# Patient Record
Sex: Male | Born: 1950 | Race: Black or African American | Hispanic: No | Marital: Married | Smoking: Never smoker
Health system: Southern US, Community
[De-identification: ages and names within clinical notes are randomized; demographics above are authoritative.]

## PROBLEM LIST (undated history)

## (undated) DIAGNOSIS — K5790 Diverticulosis of intestine, part unspecified, without perforation or abscess without bleeding: Secondary | ICD-10-CM

## (undated) DIAGNOSIS — Z1211 Encounter for screening for malignant neoplasm of colon: Secondary | ICD-10-CM

## (undated) DIAGNOSIS — R195 Other fecal abnormalities: Secondary | ICD-10-CM

## (undated) DIAGNOSIS — C61 Malignant neoplasm of prostate: Secondary | ICD-10-CM

## (undated) DIAGNOSIS — K648 Other hemorrhoids: Secondary | ICD-10-CM

## (undated) DIAGNOSIS — Z7901 Long term (current) use of anticoagulants: Secondary | ICD-10-CM

## (undated) DIAGNOSIS — D126 Benign neoplasm of colon, unspecified: Secondary | ICD-10-CM

## (undated) DIAGNOSIS — I480 Paroxysmal atrial fibrillation: Secondary | ICD-10-CM

## (undated) DIAGNOSIS — R8290 Unspecified abnormal findings in urine: Secondary | ICD-10-CM

## (undated) DIAGNOSIS — IMO0001 Reserved for inherently not codable concepts without codable children: Secondary | ICD-10-CM

## (undated) DIAGNOSIS — I10 Essential (primary) hypertension: Secondary | ICD-10-CM

## (undated) DIAGNOSIS — E119 Type 2 diabetes mellitus without complications: Secondary | ICD-10-CM

## (undated) DIAGNOSIS — E785 Hyperlipidemia, unspecified: Secondary | ICD-10-CM

## (undated) DIAGNOSIS — Z86010 Personal history of colonic polyps: Secondary | ICD-10-CM

## (undated) DIAGNOSIS — I4891 Unspecified atrial fibrillation: Secondary | ICD-10-CM

## (undated) HISTORY — DX: Other fecal abnormalities: R19.5

## (undated) HISTORY — DX: Other hemorrhoids: K64.8

## (undated) HISTORY — DX: Type 2 diabetes mellitus without complications: E11.9

## (undated) HISTORY — DX: Encounter for screening for malignant neoplasm of colon: Z12.11

## (undated) HISTORY — DX: Hyperlipidemia, unspecified: E78.5

## (undated) HISTORY — DX: Diverticulosis of intestine, part unspecified, without perforation or abscess without bleeding: K57.90

## (undated) HISTORY — DX: Essential (primary) hypertension: I10

## (undated) HISTORY — DX: Personal history of colonic polyps: Z86.010

## (undated) HISTORY — DX: Long term (current) use of anticoagulants: Z79.01

## (undated) HISTORY — DX: Benign neoplasm of colon, unspecified: D12.6

## (undated) HISTORY — PX: COLONOSCOPY: SHX174

## (undated) HISTORY — PX: POLYPECTOMY: SHX149

## (undated) HISTORY — DX: Reserved for inherently not codable concepts without codable children: IMO0001

## (undated) HISTORY — DX: Unspecified atrial fibrillation: I48.91

## (undated) HISTORY — DX: Paroxysmal atrial fibrillation: I48.0

---

## 1997-05-21 ENCOUNTER — Emergency Department (HOSPITAL_COMMUNITY): Admission: EM | Admit: 1997-05-21 | Discharge: 1997-05-21 | Payer: Self-pay | Admitting: Emergency Medicine

## 1998-06-16 ENCOUNTER — Ambulatory Visit (HOSPITAL_BASED_OUTPATIENT_CLINIC_OR_DEPARTMENT_OTHER): Admission: RE | Admit: 1998-06-16 | Discharge: 1998-06-16 | Payer: Self-pay | Admitting: Surgery

## 1998-12-28 ENCOUNTER — Emergency Department (HOSPITAL_COMMUNITY): Admission: EM | Admit: 1998-12-28 | Discharge: 1998-12-28 | Payer: Self-pay | Admitting: Emergency Medicine

## 2001-07-14 ENCOUNTER — Emergency Department (HOSPITAL_COMMUNITY): Admission: EM | Admit: 2001-07-14 | Discharge: 2001-07-15 | Payer: Self-pay | Admitting: Emergency Medicine

## 2001-07-14 ENCOUNTER — Encounter: Payer: Self-pay | Admitting: Emergency Medicine

## 2004-04-04 ENCOUNTER — Encounter: Admission: RE | Admit: 2004-04-04 | Discharge: 2004-04-04 | Payer: Self-pay | Admitting: Nephrology

## 2004-09-28 ENCOUNTER — Encounter: Admission: RE | Admit: 2004-09-28 | Discharge: 2004-09-28 | Payer: Self-pay | Admitting: Orthopedic Surgery

## 2007-08-17 ENCOUNTER — Emergency Department (HOSPITAL_COMMUNITY): Admission: EM | Admit: 2007-08-17 | Discharge: 2007-08-17 | Payer: Self-pay | Admitting: Emergency Medicine

## 2007-11-13 ENCOUNTER — Emergency Department (HOSPITAL_COMMUNITY): Admission: EM | Admit: 2007-11-13 | Discharge: 2007-11-13 | Payer: Self-pay | Admitting: Emergency Medicine

## 2009-02-04 HISTORY — PX: PROSTATE SURGERY: SHX751

## 2009-09-05 ENCOUNTER — Ambulatory Visit: Admission: RE | Admit: 2009-09-05 | Discharge: 2009-10-25 | Payer: Self-pay | Admitting: Radiation Oncology

## 2009-09-29 ENCOUNTER — Encounter: Admission: RE | Admit: 2009-09-29 | Discharge: 2009-09-29 | Payer: Self-pay | Admitting: Urology

## 2009-11-24 ENCOUNTER — Ambulatory Visit (HOSPITAL_BASED_OUTPATIENT_CLINIC_OR_DEPARTMENT_OTHER): Admission: RE | Admit: 2009-11-24 | Discharge: 2009-11-24 | Payer: Self-pay | Admitting: Urology

## 2009-12-14 ENCOUNTER — Ambulatory Visit
Admission: RE | Admit: 2009-12-14 | Discharge: 2010-01-12 | Payer: Self-pay | Source: Home / Self Care | Attending: Radiation Oncology | Admitting: Radiation Oncology

## 2010-03-19 ENCOUNTER — Emergency Department (HOSPITAL_COMMUNITY): Payer: BC Managed Care – PPO

## 2010-03-19 ENCOUNTER — Observation Stay (HOSPITAL_COMMUNITY)
Admission: EM | Admit: 2010-03-19 | Discharge: 2010-03-21 | Disposition: A | Discharge status: Home / Self Care | Payer: BC Managed Care – PPO | Attending: Internal Medicine | Admitting: Internal Medicine

## 2010-03-19 ENCOUNTER — Encounter (HOSPITAL_COMMUNITY): Payer: Self-pay

## 2010-03-19 DIAGNOSIS — I10 Essential (primary) hypertension: Secondary | ICD-10-CM | POA: Insufficient documentation

## 2010-03-19 DIAGNOSIS — R1013 Epigastric pain: Secondary | ICD-10-CM | POA: Insufficient documentation

## 2010-03-19 DIAGNOSIS — E860 Dehydration: Secondary | ICD-10-CM | POA: Insufficient documentation

## 2010-03-19 DIAGNOSIS — R197 Diarrhea, unspecified: Principal | ICD-10-CM | POA: Insufficient documentation

## 2010-03-19 DIAGNOSIS — E785 Hyperlipidemia, unspecified: Secondary | ICD-10-CM | POA: Insufficient documentation

## 2010-03-19 DIAGNOSIS — C61 Malignant neoplasm of prostate: Secondary | ICD-10-CM | POA: Insufficient documentation

## 2010-03-19 DIAGNOSIS — E119 Type 2 diabetes mellitus without complications: Secondary | ICD-10-CM | POA: Insufficient documentation

## 2010-03-19 DIAGNOSIS — I959 Hypotension, unspecified: Secondary | ICD-10-CM | POA: Insufficient documentation

## 2010-03-19 LAB — URINE MICROSCOPIC-ADD ON

## 2010-03-19 LAB — URINALYSIS, ROUTINE W REFLEX MICROSCOPIC
Bilirubin Urine: NEGATIVE
Ketones, ur: NEGATIVE mg/dL
Protein, ur: 100 mg/dL — AB
Urobilinogen, UA: 0.2 mg/dL (ref 0.0–1.0)
pH: 6.5 (ref 5.0–8.0)

## 2010-03-19 LAB — COMPREHENSIVE METABOLIC PANEL
ALT: 28 U/L (ref 0–53)
AST: 36 U/L (ref 0–37)
Alkaline Phosphatase: 47 U/L (ref 39–117)
BUN: 30 mg/dL — ABNORMAL HIGH (ref 6–23)
Calcium: 9.4 mg/dL (ref 8.4–10.5)
Chloride: 111 mEq/L (ref 96–112)
Creatinine, Ser: 1.97 mg/dL — ABNORMAL HIGH (ref 0.4–1.5)
GFR calc non Af Amer: 35 mL/min — ABNORMAL LOW (ref >60)
Glucose, Bld: 165 mg/dL — ABNORMAL HIGH (ref 70–99)
Potassium: 4.3 mEq/L (ref 3.5–5.1)
Sodium: 145 mEq/L (ref 135–145)
Total Protein: 6.9 g/dL (ref 6.0–8.3)

## 2010-03-19 LAB — POCT I-STAT 3, ART BLOOD GAS (G3+)
Bicarbonate: 18.9 mEq/L — ABNORMAL LOW (ref 20.0–24.0)
TCO2: 20 mmol/L (ref 0–100)
pCO2 arterial: 25 mmHg — ABNORMAL LOW (ref 35.0–45.0)
pH, Arterial: 7.488 — ABNORMAL HIGH (ref 7.350–7.450)

## 2010-03-19 LAB — POCT I-STAT, CHEM 8
BUN: 48 mg/dL — ABNORMAL HIGH (ref 6–23)
HCT: 53 % — ABNORMAL HIGH (ref 39.0–52.0)
Hemoglobin: 18 g/dL — ABNORMAL HIGH (ref 13.0–17.0)
Potassium: 5.1 mEq/L (ref 3.5–5.1)
Sodium: 140 mEq/L (ref 135–145)

## 2010-03-19 LAB — PROTIME-INR
INR: 0.87 (ref 0.00–1.49)
Prothrombin Time: 12 seconds (ref 11.6–15.2)
Prothrombin Time: 12.8 seconds (ref 11.6–15.2)

## 2010-03-19 LAB — POCT CARDIAC MARKERS
CKMB, poc: 1.6 ng/mL (ref 1.0–8.0)
Myoglobin, poc: 256 ng/mL (ref 12–200)
Troponin i, poc: <0.05 ng/mL (ref 0.00–0.09)

## 2010-03-19 LAB — CBC
MCH: 32.3 pg (ref 26.0–34.0)
MCHC: 35 g/dL (ref 30.0–36.0)
Platelets: 177 10*3/uL (ref 150–400)

## 2010-03-19 LAB — GLUCOSE, CAPILLARY

## 2010-03-19 LAB — OCCULT BLOOD, POC DEVICE: Fecal Occult Bld: POSITIVE

## 2010-03-19 LAB — LACTIC ACID, PLASMA: Lactic Acid, Venous: 2.5 mmol/L — ABNORMAL HIGH (ref 0.5–2.2)

## 2010-03-19 LAB — PROCALCITONIN: Procalcitonin: <0.1 ng/mL

## 2010-03-19 LAB — LIPASE, BLOOD: Lipase: 41 U/L (ref 11–59)

## 2010-03-19 LAB — DIFFERENTIAL
Basophils Absolute: 0 10*3/uL (ref 0.0–0.1)
Neutro Abs: 3.2 10*3/uL (ref 1.7–7.7)

## 2010-03-20 LAB — BASIC METABOLIC PANEL
CO2: 23 mEq/L (ref 19–32)
Calcium: 8.7 mg/dL (ref 8.4–10.5)
GFR calc Af Amer: 59 mL/min — ABNORMAL LOW (ref >60)
Potassium: 3.3 mEq/L — ABNORMAL LOW (ref 3.5–5.1)
Sodium: 143 mEq/L (ref 135–145)

## 2010-03-20 LAB — GLUCOSE, CAPILLARY
Glucose-Capillary: 119 mg/dL — ABNORMAL HIGH (ref 70–99)
Glucose-Capillary: 145 mg/dL — ABNORMAL HIGH (ref 70–99)
Glucose-Capillary: 226 mg/dL — ABNORMAL HIGH (ref 70–99)

## 2010-03-21 LAB — GLUCOSE, CAPILLARY: Glucose-Capillary: 115 mg/dL — ABNORMAL HIGH (ref 70–99)

## 2010-03-21 LAB — BASIC METABOLIC PANEL
BUN: 15 mg/dL (ref 6–23)
Creatinine, Ser: 1.33 mg/dL (ref 0.4–1.5)
GFR calc non Af Amer: 55 mL/min — ABNORMAL LOW (ref >60)
Potassium: 3.7 mEq/L (ref 3.5–5.1)

## 2010-03-25 LAB — CULTURE, BLOOD (ROUTINE X 2)
Culture  Setup Time: 201202131557
Culture  Setup Time: 201202131558
Culture: NO GROWTH

## 2010-03-26 NOTE — Discharge Summary (Signed)
NAMEJAMAUL, HEIST               ACCOUNT NO.:  [id number]  MEDICAL RECORD NO.:  [id number]           PATIENT TYPE:  I  LOCATION:  3738                         FACILITY:  [facility]  PHYSICIAN:  Jeoffrey Massed, MD    DATE OF BIRTH:  December 01, 1950  DATE OF ADMISSION:  03/19/2010 DATE OF DISCHARGE:                        DISCHARGE SUMMARY - REFERRING   PRIMARY CARE PRACTITIONER:  Dr. Caryn Bee L. Little, M.D. of [facility].  PRIMARY DISCHARGE DIAGNOSES: 1. Diarrhea likely viral, now resolved. 2. Acute kidney injury, now resolved. 3. Dehydration, now resolved. 4. Stool positive for occult blood will need outpatient workup. 5. Hypotension on admission secondary to diarrhea and dehydration.  SECONDARY DISCHARGE DIAGNOSES: 1. Hypertension. 2. History of prostate adenocarcinoma, status post brachytherapy. 3. Type 2 diabetes. 4. Benign prostatic hypertrophy. 5. Dyslipidemia.  DISCHARGE MEDICATIONS: 1. Actos 45 mg 1 tablet daily. 2. Amlodipine 10 mg 1 tablet p.o. daily. 3. Aspirin 81 mg 1 tablet p.o. daily. 4. Enalapril 20 mg 1 tablet p.o. twice daily. 5. Flomax 0.4 mg 1 capsule p.o. at bedtime. 6. Lipitor 40 mg 1 tablet p.o. daily. 7. Maalox 30 AML p.o. q.8 h p.r.n. 8. Rapaflo 8 mg 1 capsule p.o. daily. 9. Uribel capsule, 1 capsule p.o. 4 times a day. 10.Zantac 150 mg 1 tablet p.o. twice daily.  CONSULTATIONS:  None.  BRIEF HISTORY OF PRESENT ILLNESS:  The patient is a very pleasant 59- year-old [place] American male with a history of prostate cancer, status post brachytherapy in October 2011, came on the March 19, 2010 with abdominal pain and profuse diarrhea.  He described the diarrhea is watery and nonbloody.  He described the abdominal pain is cramping, which improved after passing stool.  In the ED, he was very dehydrated and hemodynamically unstable with a systolic blood pressure in the 60s. He was then resuscitated with IV fluids.  He was then admitted to  the hospitalist service for further evaluation and treatment.  For further details please see the history and physical that was dictated by Dr. Cleotis Lema on admission.  PERTINENT LABORATORY DATA: 1. Chemistries on discharge shows sodium of 143, potassium of 3.7,     chloride of 113, bicarb of 24, BUN of 15, creatinine of 1.33,     calcium of 8.7. 2. Blood cultures are negative so far. 3. Lactic acid on admission was 4.9.  Chemistries on admission showed a creatinine of 1.97.  RADIOLOGICAL STUDIES: 1. CT of the abdomen and pelvis without contrast showed a fluid-filled     colon.  Otherwise benign-appearing abdomen. 2. CT of the chest without contrast showed no acute abnormalities.  BRIEF HOSPITAL COURSE: 1. Diarrhea.  This was most likely possibly a viral process as the     patient did not have any evidence of fever or elevated WBC count.     However, the patient did have a low blood pressure and was     tachycardic and was dehydrated.  He was resuscitated by IV fluids.     He was empirically started on Flagyl; however, upon admission his    diarrhea completely resolves.  Unfortunately, since the diarrhea  resolved, we were not able to sent off any stool studies.  He     currently is able to tolerate a regular diet and is anxious to go     home.  Since there was no fever or leukocytosis, at this point his     diarrhea is presumed secondary to perhaps a viral process and his     antibiotics will be discontinued. 2. Hypotension.  This was secondary to above.  This resolved with     hydration. 3. Dehydration.  This is this was resolved with IV fluids and was     obviously secondary to diarrhea.  At this point tolerating a     regular diet.  IV fluids have been discontinued early this morning. 4. Acute kidney injury.  This was again secondary to prerenal     secondary to above mentioned causes.  This is resolved with IV     fluids. 5. History of hypertension.  This patient has a  history of     hypertension and apparently is on numerous antihypertensive     medications.  Because he is coming off an acute illness, where he     was hypovolemic, all of his antihypertensive medications have been     placed on hold.  On discharge, he will be resumed on 5 mg of     amlodipine and he will need to follow up with his primary care     practitioner in the next few days to reevaluated where his blood     pressure is and to see if he will need addition of his other     antihypertensive regimen. This was explained to the patient in     details and he claims he will call his primary care practitioner     and get an appointment as soon as possible. 6. Prostate cancer.  The patient is status post reactive seed     implantation in October of past year.  He is currently symptomatic     in terms of symptoms and he has restraints significantly to void,     but is able to void of a little.  The patient is currently     maintained on Flomax and Rapaflo and this will be continued on     discharge as well.  He is to follow up with his primary urologist     at his next scheduled appointment. 7. Dyslipidemia.  He is to continue Lipitor. 8. Diabetes.  He is to continue Actos.  DISPOSITION:  The patient will be discharged home.  FOLLOWUP INSTRUCTIONS: 1. The patient needs to follow up with his primary care practitioner     within 5-7 days upon discharge.  He used to call and make an     appointment, he claims understanding. 2. The patient is to follow up with his primary urologist at his next     scheduled appointment. 3. Upon follow up with his primary care practitioner, his blood     pressure needs to be assessed and further     antihypertensive medications will need to be added on if it is     high.  Currently, he will be discharged on just 5 mg of amlodipine     and rest of his antihypertensive medications will be placed on     hold.  Total time spent equal to 45  minutes.     Jeoffrey Massed, MD     SG/MEDQ  D:  03/21/2010  T:  03/21/2010  Job:  [id number]  cc:   Anna Genre. Little, M.D. Artist Pais Kathrynn Running, M.D. Veverly Fells. Vernie Ammons, M.D.  Electronically Signed by Jeoffrey Massed  on 03/26/2010 04:27:27 PM

## 2010-04-12 NOTE — H&P (Signed)
NAMEARBIE, BLANKLEY NO.:  [id number]  MEDICAL RECORD NO.:  [id number]           PATIENT TYPE:  E  LOCATION:  [facility]                         FACILITY:  [facility]  PHYSICIAN:  Damon Najjar, MD     DATE OF BIRTH:  [birth year]  DATE OF ADMISSION:  03/19/2010 DATE OF DISCHARGE:                             HISTORY & PHYSICAL   PCP:  Damon Genre. Little, MD.  ONCOLOGIST:  Damon Pais. Damon Running, MD.  UROLOGIST:  [facility].  CODE STATUS:  The patient is a full code.  CHIEF COMPLAINT:  Abdominal pain/diarrhea.  HISTORY OF PRESENT ILLNESS:  [name] is a 60 year old African- American man with history of prostate cancer status post seed implant back in October 2011.  The patient presented to the ED today with a chief complaint of profuse diarrhea and abdominal pain started around 6:00 a.m. this morning.  He described his diarrhea as watery, nonbloody, recurrent like 5-6 times since this a.m., large amount associated with abdominal cramping/discomfort which improved with passing stool.  He denies any associated nausea or vomiting.  Denies any fever or chills. The patient also denies any sick contacts and denies any change in his diet or eating any different kinds of food.  In the ED on presentation, the patient was initially hemodynamically unstable with systolic blood pressure in the 60 and he was tachycardic.  The patient was resuscitated with 3 liters of IV fluids.  He was found to have elevated lactate of 5. PCCM was consulted by the ED physician, however, they deferred admission to triad hospitalist since the patient's vital signs stabilized with IV fluid resuscitation.  PAST MEDICAL HISTORY: 1. Significant for prostate adenocarcinoma status post seed     implantation in October 2011, followed by Dr. Kathrynn Krueger. 2. Diabetes mellitus type 2. 3. Hypertension.  ALLERGIES:  No known drug allergies.  HOME MEDICATIONS: 1. Vasotec 20 mg p.o. b.i.d. 2.  Actos 45 mg p.o. daily. 3. Norvasc 10 mg p.o. daily. 4. Lipitor 40 mg at bedtime.  SOCIAL HISTORY:  He is married, lives with wife.  Works as a Airline pilot at [employer].  Denies any smoking.  Drinks alcohol socially.  REVIEW OF SYSTEMS:  CHEST:  Denies any cough or shortness of breath. CARDIOVASCULAR:  Denies any chest pain or palpitation.  ABDOMEN: Significant for profuse diarrhea, abdominal pain/discomfort.  No nausea. No vomiting.  Constitutional:  No fever or chills.  GU:  The patient has difficulty urinating.  No hematuria.  Denies any flank pain or dysuria.  FAMILY HISTORY:  Noncontributory.  PHYSICAL EXAM:  VITAL SIGNS:  Blood pressure 120/89, most recent blood pressure is 122/83, pulse 98, temperature 98.1, and O2 sat 96% on room air. GENERAL:  He is alert and oriented x3, in mild distress. NECK:  Supple.  No JVD. LUNGS:  Clear to auscultation bilaterally.  I did not appreciate any rales or wheezing. CARDIOVASCULAR:  S1 and S2, regular rhythm and rate.  No murmurs. ABDOMEN:  Obese, soft, nontender.  Bowel sounds heard normally. EXTREMITIES:  No pedal edema. NEUROLOGIC:  The patient moves all his extremities and  has no focal neurological deficit appreciated.  LABORATORY DATA:  Lactic acid 2.5, procalcitonin less than 0.10, lipase 41, potassium 4.3 stable but hemolyzed, CO2 of 22, glucose 165, BUN 30, creatinine 1.97.  GFR of 43.  Normal LFTs.  INR 0.94.  Cardiac enzymes, troponin less than 0.05.  Stool for occult blood positive.  RADIOLOGY/IMAGING:  CT scan of the chest showed no acute abnormalities. CT of abdomen and pelvis without contrast showed fluid-filled colon, otherwise benign-appearing abdomen.  Chest x-ray showed slight atelectasis at the right lung base.  ASSESSMENT AND PLAN:  [name] is a 60 year old African- American man with the above medical history presenting to the ER with sudden onset of abdominal pain and profuse diarrhea. 1.  Diarrhea, most likely secondary to gastroenteritis, probably viral,     however, Clostridium difficile has to be ruled out.  We will send     Clostridium difficile PCR.  The patient will be started on Flagyl     pending PCR results.  He will also be placed on contact isolation.     We will hydrate him with normal saline and keep him on clear liquid     diet for now.  His abdominal exam is currently benign and his     abdominal pain had resolved.  Send stool also for stool culture and     other stool studies. 2. Diabetes.  I will hold his oral hypoglycemic agents and keep him on     insulin sliding scale.  Monitor CBGs very closely. 3. Hypertension.  The patient is currently hypotensive, however,     corrected with IV fluid.  I will hold his antihypertensives for now     and continue to observe. 4. Prostate cancer status post seed implantation.  The patient to     follow with his oncologist as an outpatient. 5. Stool positivity for occult blood with no evidence of active bleed.     Continue to monitor his hemoglobin and hematocrit during this     hospitalization and repeat his stool for occult blood and that can     be followed also as an outpatient. 6. Acute kidney injury/chronic kidney disease.  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  He was previously     followed by Dr. Lowell Krueger from [facility].  His     creatinine today is 1.9, which is above his baseline as per him.     His baseline is around 1.3-1.4.  Elevated creatinine is most likely     secondary to dehydration and hopefully will improve with IV fluid.  DISPOSITION:  The patient will probably be discharged to home in the next 1-2 days.  CODE STATUS:  The patient is full code.          ______________________________ Damon Najjar, MD     SA/MEDQ  D:  03/19/2010  T:  03/19/2010  Job:  [id number]  cc:   Damon Bee L.  Krueger, M.D. Damon Pais Damon Krueger, M.D.  Electronically Signed by Hannah Beat MD on 04/11/2010 09:25:44 PM

## 2010-04-18 LAB — COMPREHENSIVE METABOLIC PANEL
ALT: 31 U/L (ref 0–53)
AST: 25 U/L (ref 0–37)
Albumin: 3.6 g/dL (ref 3.5–5.2)
Alkaline Phosphatase: 59 U/L (ref 39–117)
BUN: 30 mg/dL — ABNORMAL HIGH (ref 6–23)
CO2: 30 mEq/L (ref 19–32)
Calcium: 9.5 mg/dL (ref 8.4–10.5)
Chloride: 106 mEq/L (ref 96–112)
Creatinine, Ser: 1.27 mg/dL (ref 0.4–1.5)
GFR calc Af Amer: >60 mL/min (ref >60)
GFR calc non Af Amer: 58 mL/min — ABNORMAL LOW (ref >60)
Glucose, Bld: 136 mg/dL — ABNORMAL HIGH (ref 70–99)
Potassium: 4.2 mEq/L (ref 3.5–5.1)
Sodium: 140 mEq/L (ref 135–145)
Total Bilirubin: 0.8 mg/dL (ref 0.3–1.2)
Total Protein: 6.7 g/dL (ref 6.0–8.3)

## 2010-04-18 LAB — GLUCOSE, CAPILLARY: Glucose-Capillary: 135 mg/dL — ABNORMAL HIGH (ref 70–99)

## 2010-04-18 LAB — CBC
HCT: 46.2 % (ref 39.0–52.0)
Hemoglobin: 15.6 g/dL (ref 13.0–17.0)
MCH: 32.6 pg (ref 26.0–34.0)
MCHC: 33.8 g/dL (ref 30.0–36.0)
MCV: 96.5 fL (ref 78.0–100.0)
Platelets: 145 10*3/uL — ABNORMAL LOW (ref 150–400)
RBC: 4.78 MIL/uL (ref 4.22–5.81)
RDW: 15 % (ref 11.5–15.5)
WBC: 6.5 10*3/uL (ref 4.0–10.5)

## 2010-04-18 LAB — PROTIME-INR
INR: 0.93 (ref 0.00–1.49)
Prothrombin Time: 12.7 seconds (ref 11.6–15.2)

## 2010-04-18 LAB — APTT: aPTT: 24 seconds (ref 24–37)

## 2010-11-05 LAB — DIFFERENTIAL
Basophils Absolute: 0
Basophils Relative: 0
Eosinophils Relative: 10 — ABNORMAL HIGH
Lymphocytes Relative: 33

## 2010-11-05 LAB — CK TOTAL AND CKMB (NOT AT ARMC)
CK, MB: 2.6
Relative Index: 1
Relative Index: 1
Total CK: 258 — ABNORMAL HIGH

## 2010-11-05 LAB — BASIC METABOLIC PANEL
Calcium: 9.1
GFR calc Af Amer: >60
GFR calc non Af Amer: >60
Glucose, Bld: 209 — ABNORMAL HIGH
Sodium: 136

## 2010-11-05 LAB — CBC
Hemoglobin: 14.5
RBC: 4.55
RDW: 14.6

## 2010-11-05 LAB — TROPONIN I: Troponin I: 0.01

## 2011-01-23 ENCOUNTER — Encounter: Payer: Self-pay | Admitting: *Deleted

## 2011-01-23 NOTE — Progress Notes (Signed)
09/05/09 I-PPS =1100112 score = 6 09/05/09 IIEF score= [id number]

## 2011-01-23 NOTE — Progress Notes (Signed)
On 12/15/09 I=PSS results [id number] score=26

## 2013-02-10 ENCOUNTER — Observation Stay (HOSPITAL_COMMUNITY)
Admission: EM | Admit: 2013-02-10 | Discharge: 2013-02-11 | Disposition: A | Discharge status: Home / Self Care | Payer: BC Managed Care – PPO | Attending: Cardiovascular Disease | Admitting: Cardiovascular Disease

## 2013-02-10 ENCOUNTER — Encounter (HOSPITAL_COMMUNITY): Payer: Self-pay | Admitting: Emergency Medicine

## 2013-02-10 ENCOUNTER — Emergency Department (HOSPITAL_COMMUNITY): Payer: BC Managed Care – PPO

## 2013-02-10 DIAGNOSIS — J069 Acute upper respiratory infection, unspecified: Secondary | ICD-10-CM | POA: Insufficient documentation

## 2013-02-10 DIAGNOSIS — E119 Type 2 diabetes mellitus without complications: Secondary | ICD-10-CM | POA: Insufficient documentation

## 2013-02-10 DIAGNOSIS — I10 Essential (primary) hypertension: Secondary | ICD-10-CM | POA: Insufficient documentation

## 2013-02-10 DIAGNOSIS — Z7982 Long term (current) use of aspirin: Secondary | ICD-10-CM | POA: Insufficient documentation

## 2013-02-10 DIAGNOSIS — Z23 Encounter for immunization: Secondary | ICD-10-CM | POA: Insufficient documentation

## 2013-02-10 DIAGNOSIS — I4891 Unspecified atrial fibrillation: Secondary | ICD-10-CM

## 2013-02-10 DIAGNOSIS — Z8546 Personal history of malignant neoplasm of prostate: Secondary | ICD-10-CM | POA: Insufficient documentation

## 2013-02-10 DIAGNOSIS — R823 Hemoglobinuria: Secondary | ICD-10-CM | POA: Insufficient documentation

## 2013-02-10 DIAGNOSIS — B9789 Other viral agents as the cause of diseases classified elsewhere: Secondary | ICD-10-CM | POA: Insufficient documentation

## 2013-02-10 HISTORY — DX: Unspecified abnormal findings in urine: R82.90

## 2013-02-10 HISTORY — DX: Type 2 diabetes mellitus without complications: E11.9

## 2013-02-10 HISTORY — DX: Unspecified atrial fibrillation: I48.91

## 2013-02-10 HISTORY — DX: Essential (primary) hypertension: I10

## 2013-02-10 HISTORY — DX: Malignant neoplasm of prostate: C61

## 2013-02-10 LAB — CBC WITH DIFFERENTIAL/PLATELET
BASOS ABS: 0 10*3/uL (ref 0.0–0.1)
BASOS PCT: 0 % (ref 0–1)
Eosinophils Absolute: 0.2 10*3/uL (ref 0.0–0.7)
Eosinophils Relative: 2 % (ref 0–5)
HCT: 45.7 % (ref 39.0–52.0)
Hemoglobin: 15.4 g/dL (ref 13.0–17.0)
LYMPHS PCT: 18 % (ref 12–46)
Lymphs Abs: 1.6 10*3/uL (ref 0.7–4.0)
MCH: 30.6 pg (ref 26.0–34.0)
MCHC: 33.7 g/dL (ref 30.0–36.0)
MCV: 90.9 fL (ref 78.0–100.0)
Monocytes Absolute: 0.8 10*3/uL (ref 0.1–1.0)
Monocytes Relative: 8 % (ref 3–12)
NEUTROS ABS: 6.5 10*3/uL (ref 1.7–7.7)
NEUTROS PCT: 72 % (ref 43–77)
Platelets: 165 10*3/uL (ref 150–400)
RBC: 5.03 MIL/uL (ref 4.22–5.81)
RDW: 14.4 % (ref 11.5–15.5)
WBC: 9.1 10*3/uL (ref 4.0–10.5)

## 2013-02-10 LAB — URINE MICROSCOPIC-ADD ON

## 2013-02-10 LAB — COMPREHENSIVE METABOLIC PANEL
ALK PHOS: 60 U/L (ref 39–117)
ALT: 25 U/L (ref 0–53)
AST: 18 U/L (ref 0–37)
Albumin: 3.4 g/dL — ABNORMAL LOW (ref 3.5–5.2)
BILIRUBIN TOTAL: 0.9 mg/dL (ref 0.3–1.2)
BUN: 22 mg/dL (ref 6–23)
CHLORIDE: 103 meq/L (ref 96–112)
CO2: 23 meq/L (ref 19–32)
Calcium: 9.2 mg/dL (ref 8.4–10.5)
Creatinine, Ser: 1.12 mg/dL (ref 0.50–1.35)
GFR calc Af Amer: 81 mL/min — ABNORMAL LOW (ref >90)
GFR calc non Af Amer: 70 mL/min — ABNORMAL LOW (ref >90)
Glucose, Bld: 224 mg/dL — ABNORMAL HIGH (ref 70–99)
Potassium: 4.1 mEq/L (ref 3.7–5.3)
Sodium: 142 mEq/L (ref 137–147)
Total Protein: 7.3 g/dL (ref 6.0–8.3)

## 2013-02-10 LAB — URINALYSIS, ROUTINE W REFLEX MICROSCOPIC
Bilirubin Urine: NEGATIVE
Glucose, UA: >1000 mg/dL — AB
KETONES UR: NEGATIVE mg/dL
Leukocytes, UA: NEGATIVE
Nitrite: NEGATIVE
PROTEIN: NEGATIVE mg/dL
Specific Gravity, Urine: 1.021 (ref 1.005–1.030)
Urobilinogen, UA: 0.2 mg/dL (ref 0.0–1.0)
pH: 5.5 (ref 5.0–8.0)

## 2013-02-10 LAB — POCT I-STAT TROPONIN I: Troponin i, poc: 0 ng/mL (ref 0.00–0.08)

## 2013-02-10 LAB — PROTIME-INR
INR: 1.03 (ref 0.00–1.49)
Prothrombin Time: 13.3 seconds (ref 11.6–15.2)

## 2013-02-10 LAB — TROPONIN I

## 2013-02-10 LAB — MAGNESIUM: MAGNESIUM: 1.8 mg/dL (ref 1.5–2.5)

## 2013-02-10 LAB — CG4 I-STAT (LACTIC ACID): Lactic Acid, Venous: 2.91 mmol/L — ABNORMAL HIGH (ref 0.5–2.2)

## 2013-02-10 LAB — KETONES, QUALITATIVE: Acetone, Bld: NEGATIVE

## 2013-02-10 MED ORDER — ACETAMINOPHEN 325 MG PO TABS: 650.0000 mg | ORAL_TABLET | ORAL | Status: DC | PRN

## 2013-02-10 MED ORDER — HEPARIN (PORCINE) IN NACL 100-0.45 UNIT/ML-% IJ SOLN
1300.0000 [IU]/h | INTRAMUSCULAR | Status: DC
  Administered 2013-02-10: 1300 [IU]/h via INTRAVENOUS
  Filled 2013-02-10 (×3): qty 250

## 2013-02-10 MED ORDER — TELMISARTAN-HCTZ 80-12.5 MG PO TABS: 1.0000 | ORAL_TABLET | Freq: Every day | ORAL | Status: DC

## 2013-02-10 MED ORDER — HYDROCHLOROTHIAZIDE 12.5 MG PO CAPS
12.5000 mg | ORAL_CAPSULE | Freq: Every day | ORAL | Status: DC
  Filled 2013-02-10: qty 1

## 2013-02-10 MED ORDER — DILTIAZEM HCL 25 MG/5ML IV SOLN
20.0000 mg | Freq: Once | INTRAVENOUS | Status: AC
  Administered 2013-02-10: 20 mg via INTRAVENOUS

## 2013-02-10 MED ORDER — ENALAPRIL MALEATE 20 MG PO TABS
20.0000 mg | ORAL_TABLET | Freq: Two times a day (BID) | ORAL | Status: DC
  Administered 2013-02-10 – 2013-02-11 (×2): 20 mg via ORAL
  Filled 2013-02-10 (×3): qty 1

## 2013-02-10 MED ORDER — IRBESARTAN 300 MG PO TABS
300.0000 mg | ORAL_TABLET | Freq: Every day | ORAL | Status: DC
  Administered 2013-02-10: 300 mg via ORAL
  Filled 2013-02-10 (×2): qty 1

## 2013-02-10 MED ORDER — ONDANSETRON HCL 4 MG/2ML IJ SOLN: 4.0000 mg | Freq: Four times a day (QID) | INTRAMUSCULAR | Status: DC | PRN

## 2013-02-10 MED ORDER — HEPARIN BOLUS VIA INFUSION
4000.0000 [IU] | Freq: Once | INTRAVENOUS | Status: AC
  Administered 2013-02-10: 4000 [IU] via INTRAVENOUS
  Filled 2013-02-10: qty 4000

## 2013-02-10 MED ORDER — DILTIAZEM HCL 100 MG IV SOLR
5.0000 mg/h | INTRAVENOUS | Status: DC
  Administered 2013-02-10: 5 mg/h via INTRAVENOUS
  Administered 2013-02-11: 15 mg/h via INTRAVENOUS
  Filled 2013-02-10 (×2): qty 100

## 2013-02-10 MED ORDER — ASPIRIN EC 81 MG PO TBEC
81.0000 mg | DELAYED_RELEASE_TABLET | Freq: Every day | ORAL | Status: DC
  Administered 2013-02-10: 81 mg via ORAL
  Filled 2013-02-10 (×2): qty 1

## 2013-02-10 MED ORDER — METFORMIN HCL 500 MG PO TABS
500.0000 mg | ORAL_TABLET | Freq: Two times a day (BID) | ORAL | Status: DC
  Administered 2013-02-11 (×2): 500 mg via ORAL
  Filled 2013-02-10 (×3): qty 1

## 2013-02-10 NOTE — ED Notes (Signed)
Admitting physician at bedside

## 2013-02-10 NOTE — ED Notes (Addendum)
Called pharmacy to send cardizem drip up. Not found in pyxis.

## 2013-02-10 NOTE — ED Notes (Signed)
Ordered heart healthy food tray.

## 2013-02-10 NOTE — ED Notes (Signed)
NOTIFIED DR. Stevie Kern IN PERSON OF PATIENTS CG4 LACTIC ACID LAB RESULTS @15 :06 PM ,02/10/2013.

## 2013-02-10 NOTE — H&P (Signed)
CC: New Onset Atrial Fibrillation w/ RVR PCP: Dr. Hulan Fess    HPI: The patient is a 63 y/o male, who works as a Arts development officer at [employer], with a [place] significant for HTN, DM and prostate cancer. He denies any prior cardiac history and no family history of any cardiac related issues. His PCP is Dr. Hulan Fess. He was seen today by Dr. Rex Kras in clinic for evaluation of flu-like symptoms x 7 days, including nasal congestion, non-productive cough and body aches. The patient reports taking OTC cold & flu medications x 4 days (last dose last PM) without relief. At Dr. Eddie Dibbles office, his flu test was negative. An EKG however demonstrated Atrial Fibrillation w/ RVR. This has never happened to him before. He has been asymptomatic with this, denying palpitations, chest discomfort, SOB, dizziness, syncope/ near syncope. He denies any history of resting or exertional angina. He exercises on a treadmill 3 days a week w/o limitation. He states that he has been told that he snores, on occasion, at night but has never had a sleep study to r/o OSA. No thyroid issues that he is aware of. No HF symptoms, denying SOB, orthopnea/PND and LEE. He typically drinks 2 cups of coffee a day, but none today. He is a social drinker but denies any alcohol intake in the last 24 hrs. Denies fever, chills, n/v/d. No dysuria. No melena or hematochezia. No history of any bleeding disorders, PUD, stroke/TIA. Does not smoke.   Past Medical History  Diagnosis Date  . Diabetes mellitus   . Hypertension   . Cancer     prostate ca    Past Surgical History  Procedure Laterality Date  . Prostate surgery  2011    Family History  Problem Relation Age of Onset  . Hypertension      Social History:  reports that he has never smoked. He does not have any smokeless tobacco history on file. He reports that he drinks alcohol. He reports that he does not use illicit drugs.  Allergies: No Known  Allergies  Medications: Prior to Admission medications   Medication Sig Start Date End Date Taking? Authorizing Provider  amLODipine (NORVASC) 5 MG tablet Take 5 mg by mouth daily.   Yes Historical Provider, MD  aspirin EC 81 MG tablet Take 81 mg by mouth daily.   Yes Historical Provider, MD  enalapril (VASOTEC) 20 MG tablet Take 20 mg by mouth 2 (two) times daily.   Yes Historical Provider, MD  metFORMIN (GLUCOPHAGE) 500 MG tablet Take 500 mg by mouth 2 (two) times daily with a meal.   Yes Historical Provider, MD  OVER THE COUNTER MEDICATION Take 2 capsules by mouth 2 (two) times daily as needed (cold symptoms). Multi Cold Symptom Relief.   Yes Historical Provider, MD  telmisartan-hydrochlorothiazide (MICARDIS HCT) 80-12.5 MG per tablet Take 1 tablet by mouth daily.   Yes Historical Provider, MD     Results for orders placed during the hospital encounter of 02/10/13 (from the past 48 hour(s))  CBC WITH DIFFERENTIAL     Status: None   Collection Time    02/10/13  2:05 PM      Result Value Range   WBC 9.1  4.0 - 10.5 K/uL   RBC 5.03  4.22 - 5.81 MIL/uL   Hemoglobin 15.4  13.0 - 17.0 g/dL   HCT 45.7  39.0 - 52.0 %   MCV 90.9  78.0 - 100.0 fL   MCH 30.6  26.0 -  34.0 pg   MCHC 33.7  30.0 - 36.0 g/dL   RDW 14.4  11.5 - 15.5 %   Platelets 165  150 - 400 K/uL   Neutrophils Relative % 72  43 - 77 %   Neutro Abs 6.5  1.7 - 7.7 K/uL   Lymphocytes Relative 18  12 - 46 %   Lymphs Abs 1.6  0.7 - 4.0 K/uL   Monocytes Relative 8  3 - 12 %   Monocytes Absolute 0.8  0.1 - 1.0 K/uL   Eosinophils Relative 2  0 - 5 %   Eosinophils Absolute 0.2  0.0 - 0.7 K/uL   Basophils Relative 0  0 - 1 %   Basophils Absolute 0.0  0.0 - 0.1 K/uL  COMPREHENSIVE METABOLIC PANEL     Status: Abnormal   Collection Time    02/10/13  2:05 PM      Result Value Range   Sodium 142  137 - 147 mEq/L   Potassium 4.1  3.7 - 5.3 mEq/L   Chloride 103  96 - 112 mEq/L   CO2 23  19 - 32 mEq/L   Glucose, Bld 224 (*) 70 - 99  mg/dL   BUN 22  6 - 23 mg/dL   Creatinine, Ser 1.12  0.50 - 1.35 mg/dL   Calcium 9.2  8.4 - 10.5 mg/dL   Total Protein 7.3  6.0 - 8.3 g/dL   Albumin 3.4 (*) 3.5 - 5.2 g/dL   AST 18  0 - 37 U/L   ALT 25  0 - 53 U/L   Alkaline Phosphatase 60  39 - 117 U/L   Total Bilirubin 0.9  0.3 - 1.2 mg/dL   GFR calc non Af Amer 70 (*) >90 mL/min   GFR calc Af Amer 81 (*) >90 mL/min   Comment: (NOTE)     The eGFR has been calculated using the CKD EPI equation.     This calculation has not been validated in all clinical situations.     eGFR's persistently <90 mL/min signify possible Chronic Kidney     Disease.  MAGNESIUM     Status: None   Collection Time    02/10/13  2:05 PM      Result Value Range   Magnesium 1.8  1.5 - 2.5 mg/dL  KETONES, QUALITATIVE     Status: None   Collection Time    02/10/13  2:05 PM      Result Value Range   Acetone, Bld NEGATIVE  NEGATIVE  URINALYSIS, ROUTINE W REFLEX MICROSCOPIC     Status: Abnormal   Collection Time    02/10/13  2:11 PM      Result Value Range   Color, Urine YELLOW  YELLOW   APPearance CLEAR  CLEAR   Specific Gravity, Urine 1.021  1.005 - 1.030   pH 5.5  5.0 - 8.0   Glucose, UA >1000 (*) NEGATIVE mg/dL   Hgb urine dipstick TRACE (*) NEGATIVE   Bilirubin Urine NEGATIVE  NEGATIVE   Ketones, ur NEGATIVE  NEGATIVE mg/dL   Protein, ur NEGATIVE  NEGATIVE mg/dL   Urobilinogen, UA 0.2  0.0 - 1.0 mg/dL   Nitrite NEGATIVE  NEGATIVE   Leukocytes, UA NEGATIVE  NEGATIVE  URINE MICROSCOPIC-ADD ON     Status: None   Collection Time    02/10/13  2:11 PM      Result Value Range   WBC, UA 0-2  <3 WBC/hpf   RBC / HPF 0-2  <  3 RBC/hpf   Bacteria, UA RARE  RARE  POCT I-STAT TROPONIN I     Status: None   Collection Time    02/10/13  2:49 PM      Result Value Range   Troponin i, poc 0.00  0.00 - 0.08 ng/mL   Comment 3            Comment: Due to the release kinetics of cTnI,     a negative result within the first hours     of the onset of symptoms does  not rule out     myocardial infarction with certainty.     If myocardial infarction is still suspected,     repeat the test at appropriate intervals.  CG4 I-STAT (LACTIC ACID)     Status: Abnormal   Collection Time    02/10/13  2:53 PM      Result Value Range   Lactic Acid, Venous 2.91 (*) 0.5 - 2.2 mmol/L  PROTIME-INR     Status: None   Collection Time    02/10/13  3:10 PM      Result Value Range   Prothrombin Time 13.3  11.6 - 15.2 seconds   INR 1.03  0.00 - 1.49    Dg Chest Port 1 View  02/10/2013   CLINICAL DATA:  New onset atrial fibrillation  EXAM: PORTABLE CHEST - 1 VIEW  COMPARISON:  Portable chest x-ray dated March 19, 2010.  FINDINGS: The lungs are well-expanded. There is no focal infiltrate. There are coarse infrahilar lung markings on the right partially obscuring the medial cardio phrenic angle. The cardiopericardial silhouette is top-normal in size. The pulmonary vascularity is not engorged. The mediastinum is normal in width. There is mild tortuosity of the descending thoracic aorta. There is no pleural effusion or pneumothorax. No pulmonary parenchymal mass is demonstrated.  IMPRESSION: 1. There is no evidence of CHF. 2. Increased lung markings in the right infrahilar region may reflect subsegmental atelectasis. When the patient can tolerate the procedure, a PA and lateral chest x-ray would be of value. .   Electronically Signed   By: David  Martinique   On: 02/10/2013 14:04    Review of Systems  Constitutional: Negative for fever and chills.  HENT: Positive for congestion.   Respiratory: Positive for cough. Negative for sputum production and shortness of breath.   Cardiovascular: Negative for chest pain, palpitations, orthopnea, leg swelling and PND.  Gastrointestinal: Negative for blood in stool and melena.  Genitourinary: Negative for dysuria.  Musculoskeletal: Positive for myalgias.  Neurological: Negative for dizziness and loss of consciousness.  All other systems  reviewed and are negative.   Blood pressure 129/88, pulse 88, temperature 98 F (36.7 C), temperature source Oral, resp. rate 21, SpO2 92.00%. Physical Exam  Constitutional: He is oriented to person, place, and time. He appears well-developed and well-nourished. No distress.  Neck: No JVD present. Carotid bruit is not present.  Cardiovascular: Exam reveals no gallop and no friction rub.   No murmur heard. Pulses:      Radial pulses are 2+ on the right side, and 2+ on the left side.       Dorsalis pedis pulses are 2+ on the right side, and 2+ on the left side.  Respiratory: Effort normal and breath sounds normal. No respiratory distress. He has no wheezes. He has no rales.  GI: Soft. Bowel sounds are normal. He exhibits no distension and no mass.  Musculoskeletal: He exhibits no edema.  Neurological: He  is alert and oriented to person, place, and time.  Skin: Skin is warm and dry. He is not diaphoretic.  Psychiatric: He has a normal mood and affect. His behavior is normal.    Assessment/Plan: Active Problems:   Atrial fibrillation with rapid ventricular response   Diabetes   HTN (hypertension)  Plan: 63 y/o male w/ h/o DM, HTN and prostate cancer, who was sent to ED by his PCP after an office EKG revealed new onset atrial fib w/ RVR. Asymptomatic. Has cold-symptoms. Flu ruled-out at PCP office. Cardizem drip started in ED. Ventricular rate improved into the 90s-low 100s. BP stable. CXR w/o signs of HF or infection. Physical exam normal except for irregular rhythm. He is afebrile. WBC normal. Electrolytes normal. BG elevated at 224. No history of angina.  However, will cycle troponins x 3. Will check TSH. continuous oximetry overnight. ? Starting IV heparin. It is possible that his atrial fibrillation may have been triggered by recent OTC cold & flu medications. MD to follow.   BRITTAINY SIMMONS, PA-C 02/10/2013, 5:16 PM   Attending Note:   The patient was seen and examined.  Agree with  assessment and plan as noted above.  Changes made to the above note as needed.  Mr. Ericsson is a 63 yo man from [place].  He presents with several days of URI symptoms ( cough, malaise) .  He went to see Dr. Rex Kras thinking that he may have the flu.  He was found to have atrial fib with RVR of 140. He was sent to the ER.    He is feeling better in IV dilt.    On exam, he feels warm to the tough. BP 135/96  Pulse 100  Temp(Src) 98 F (36.7 C) (Oral)  Resp 15  SpO2 94% Lungs - clear Cor:  Irreg. Irreg. Rate is controlled. Ext: no edema  ECG:  AF with rate of 140.   Tele: AF with rate of 95 ( on dilt 5 mg an hour)   Imp/Plan  1. Atrial fib:  His CHADS VAS2 score is at least 3 ( DM and HTN).   Will get an echo to assess LV function.  Will increase dilt drip to achieve a better HR.  There is a chance that he may convert to NSR We had a long discussion about anticoagulants.  He has heard the TV commercials and is not sure if he would take a NOAC.  He did agree with IV heparin tonight. He wanted to go home tonight but I think we will be able to better control his HR and start anticoagulation if we keep him tonight. He is in agreement.   2. Viral URI:   He was negative for flu.   Will watch for any further fever.  He does not appear toxic to me. Repeat labs tomorrow      Thayer Headings, Brooke Bonito., MD, [facility] 02/10/2013, 6:00 PM

## 2013-02-10 NOTE — ED Notes (Signed)
Per EMS - pt coming from [facility] physicians, went because he thought he had the flu d/t sore throat and cough. They discovered he was having an irregular HR, pt is asymptomatic, denies palpitations. HR more elevated than normal. A.fib at doctors office. EMS 12 lead not showing a.fib. Negative flu test at doctors office. 20 G in left hand. BP 158/88 HR 94. Hx of DM, doesn't take insulin, CBG 309, pt reports he doesn't ever remember having his CBG that high before. Nad, A&Ox4.

## 2013-02-10 NOTE — Progress Notes (Signed)
ANTICOAGULATION CONSULT NOTE - Initial Consult  Pharmacy Consult:  Heparin Indication: atrial fibrillation  No Known Allergies  Patient Measurements: Height: 5\' 11"  (180.3 cm) Weight: 210 lb (95.255 kg) IBW/kg (Calculated) : 75.3 Heparin Dosing Weight: 94 kg  Vital Signs: Temp: 98.1 F (36.7 C) (01/07 1805) Temp src: Oral (01/07 1805) BP: 146/86 mmHg (01/07 1805) Pulse Rate: 48 (01/07 1805)  Labs:  Recent Labs  02/10/13 1405 02/10/13 1510  HGB 15.4  --   HCT 45.7  --   PLT 165  --   LABPROT  --  13.3  INR  --  1.03  CREATININE 1.12  --     CrCl is unknown because there is no height on file for the current visit.   Medical History: Past Medical History  Diagnosis Date  . Diabetes mellitus   . Hypertension   . Cancer     prostate ca       Assessment: 15 YOM seen at a clinic for flu-like symptom and found to have Afib with RVR.  Patient then came to [facility] ED and Pharmacy consulted to start IV heparin.  Baseline labs and home meds reviewed.   Goal of Therapy:  Heparin level 0.3-0.7 units/ml Monitor platelets by anticoagulation protocol: Yes    Plan:  - Heparin 4000 units IV bolus x 1, then - Heparin gtt at 1300 units/hr - Check 6 hr HL - Daily HL / CBC - F/U anticoagulation plan    Gayla Benn D. Mina Marble, PharmD, BCPS Pager:  [phone] 02/10/2013, 6:27 PM

## 2013-02-10 NOTE — ED Notes (Signed)
Portable xray at bedside.

## 2013-02-10 NOTE — ED Provider Notes (Signed)
CSN: [id number]     Arrival date & time 02/10/13  1249 History   First MD Initiated Contact with Patient 02/10/13 1312     Chief Complaint  Patient presents with  . Atrial Fibrillation  cough (Consider location/radiation/quality/duration/timing/severity/associated sxs/prior Treatment) HPI 4-5 days of cough nasal congestion runny nose slight sore throat minimal body aches but no fever no confusion no rash no shortness of breath no chest pain no palpitations no lightheadedness no focal neurologic symptoms no severe headache but was noted to have a blood sugar over 300 today as well as rapid atrial fibrillation which has not had previously when he was at the [place] clinic so he is sent to the emergency department for evaluation.   Past Medical History  Diagnosis Date  . Diabetes mellitus   . Hypertension   . Cancer     prostate ca   Past Surgical History  Procedure Laterality Date  . Prostate surgery  2011   Family History  Problem Relation Age of Onset  . Hypertension     History  Substance Use Topics  . Smoking status: Never Smoker   . Smokeless tobacco: Not on file  . Alcohol Use: Yes     Comment: occasionally    Review of Systems 10 Systems reviewed and are negative for acute change except as noted in the HPI. Allergies  Review of patient's allergies indicates no known allergies.  Home Medications   No current outpatient prescriptions on file. BP 123/87  Pulse 74  Temp(Src) 98.5 F (36.9 C) (Oral)  Resp 24  Ht 5\' 11"  (1.803 m)  Wt 210 lb (95.255 kg)  BMI 29.30 kg/m2  SpO2 93% Physical Exam  Nursing note and vitals reviewed. Constitutional:  Awake, alert, nontoxic appearance.  HENT:  Head: Atraumatic.  Eyes: Right eye exhibits no discharge. Left eye exhibits no discharge.  Neck: Neck supple.  Cardiovascular:  No murmur heard. Tachycardic irregularly irregular rhythm  Pulmonary/Chest: Effort normal and breath sounds normal. No respiratory distress. He has  no wheezes. He has no rales. He exhibits no tenderness.  Abdominal: Soft. There is no tenderness. There is no rebound.  Musculoskeletal: He exhibits no edema and no tenderness.  Baseline ROM, no obvious new focal weakness.  Neurological: He is alert.  Mental status and motor strength appears baseline for patient and situation.  Skin: No rash noted.  Psychiatric: He has a normal mood and affect.    ED Course  Procedures (including critical care time) Patient / Family / Caregiver understand and agree with initial ED impression and plan with expectations set for ED visit. Patient / Family / Caregiver informed of clinical course, understand medical decision-making process, and agree with plan. Pt stable in ED with no significant deterioration in condition. Cards will see in ED to determine disposition. [place] Performed by: Babette Relic Total critical care time: 7min including rate control with diltiazem drip. Critical care time was exclusive of separately billable procedures and treating other patients. Critical care was necessary to treat or prevent imminent or life-threatening deterioration. Critical care was time spent personally by me on the following activities: development of treatment plan with patient and/or surrogate as well as nursing, discussions with consultants, evaluation of patient's response to treatment, examination of patient, obtaining history from patient or surrogate, ordering and performing treatments and interventions, ordering and review of laboratory studies, ordering and review of radiographic studies, pulse oximetry and re-evaluation of patient's condition.  Labs Review Labs Reviewed  COMPREHENSIVE METABOLIC  PANEL - Abnormal; Notable for the following:    Glucose, Bld 224 (*)    Albumin 3.4 (*)    GFR calc non Af Amer 70 (*)    GFR calc Af Amer 81 (*)    All other components within normal limits  URINALYSIS, ROUTINE W REFLEX MICROSCOPIC -  Abnormal; Notable for the following:    Glucose, UA >1000 (*)    Hgb urine dipstick TRACE (*)    All other components within normal limits  CG4 I-STAT (LACTIC ACID) - Abnormal; Notable for the following:    Lactic Acid, Venous 2.91 (*)    All other components within normal limits  MRSA PCR SCREENING  CBC WITH DIFFERENTIAL  MAGNESIUM  KETONES, QUALITATIVE  URINE MICROSCOPIC-ADD ON  PROTIME-INR  TROPONIN I  TROPONIN I  TROPONIN I  TSH  CBC  BASIC METABOLIC PANEL  HEPARIN LEVEL (UNFRACTIONATED)  POCT I-STAT TROPONIN I   Imaging Review Dg Chest Port 1 View  02/10/2013   CLINICAL DATA:  New onset atrial fibrillation  EXAM: PORTABLE CHEST - 1 VIEW  COMPARISON:  Portable chest x-ray dated March 19, 2010.  FINDINGS: The lungs are well-expanded. There is no focal infiltrate. There are coarse infrahilar lung markings on the right partially obscuring the medial cardio phrenic angle. The cardiopericardial silhouette is top-normal in size. The pulmonary vascularity is not engorged. The mediastinum is normal in width. There is mild tortuosity of the descending thoracic aorta. There is no pleural effusion or pneumothorax. No pulmonary parenchymal mass is demonstrated.  IMPRESSION: 1. There is no evidence of CHF. 2. Increased lung markings in the right infrahilar region may reflect subsegmental atelectasis. When the patient can tolerate the procedure, a PA and lateral chest x-ray would be of value. .   Electronically Signed   By: David  Martinique   On: 02/10/2013 14:04    EKG Interpretation    Date/Time:  Wednesday February 10 2013 13:01:07 EST Ventricular Rate:  140 PR Interval:    QRS Duration: 97 QT Interval:  317 QTC Calculation: 484 R Axis:   0 Text Interpretation:  Atrial fibrillation with rapid ventricular response Ventricular premature complex Borderline prolonged QT interval Compared to previous tracing Atrial fibrillation NOW PRESENT Confirmed by Stevie Kern  MD, Gustabo Gordillo ([id number]) on 02/10/2013  1:16:10 PM            MDM   1. Atrial fibrillation with rapid ventricular response   2. Diabetes   3. HTN (hypertension)    The patient appears reasonably stabilized for admission considering the current resources, flow, and capabilities available in the ED at this time, and I doubt any other [facility] requiring further screening and/or treatment in the ED prior to admission.    Babette Relic, MD 02/11/13 [phone]

## 2013-02-11 ENCOUNTER — Encounter (HOSPITAL_COMMUNITY): Payer: Self-pay | Admitting: Physician Assistant

## 2013-02-11 ENCOUNTER — Observation Stay (HOSPITAL_COMMUNITY): Payer: BC Managed Care – PPO

## 2013-02-11 DIAGNOSIS — J069 Acute upper respiratory infection, unspecified: Secondary | ICD-10-CM

## 2013-02-11 DIAGNOSIS — R8290 Unspecified abnormal findings in urine: Secondary | ICD-10-CM | POA: Insufficient documentation

## 2013-02-11 DIAGNOSIS — I369 Nonrheumatic tricuspid valve disorder, unspecified: Secondary | ICD-10-CM

## 2013-02-11 LAB — CBC
HEMATOCRIT: 47 % (ref 39.0–52.0)
HEMOGLOBIN: 16.6 g/dL (ref 13.0–17.0)
MCH: 32.6 pg (ref 26.0–34.0)
MCHC: 35.3 g/dL (ref 30.0–36.0)
MCV: 92.3 fL (ref 78.0–100.0)
Platelets: 185 10*3/uL (ref 150–400)
RBC: 5.09 MIL/uL (ref 4.22–5.81)
RDW: 14.4 % (ref 11.5–15.5)
WBC: 10.3 10*3/uL (ref 4.0–10.5)

## 2013-02-11 LAB — BASIC METABOLIC PANEL
BUN: 17 mg/dL (ref 6–23)
CO2: 25 mEq/L (ref 19–32)
CREATININE: 1.07 mg/dL (ref 0.50–1.35)
Calcium: 9.3 mg/dL (ref 8.4–10.5)
Chloride: 102 mEq/L (ref 96–112)
GFR calc Af Amer: 85 mL/min — ABNORMAL LOW (ref >90)
GFR, EST NON AFRICAN AMERICAN: 74 mL/min — AB (ref >90)
Glucose, Bld: 173 mg/dL — ABNORMAL HIGH (ref 70–99)
Potassium: 3.8 mEq/L (ref 3.7–5.3)
Sodium: 142 mEq/L (ref 137–147)

## 2013-02-11 LAB — TROPONIN I
Troponin I: <0.3 ng/mL (ref <0.30)
Troponin I: <0.3 ng/mL (ref <0.30)

## 2013-02-11 LAB — TSH: TSH: 1.396 u[IU]/mL (ref 0.350–4.500)

## 2013-02-11 LAB — MRSA PCR SCREENING: MRSA by PCR: NEGATIVE

## 2013-02-11 LAB — HEPARIN LEVEL (UNFRACTIONATED)
HEPARIN UNFRACTIONATED: 0.58 [IU]/mL (ref 0.30–0.70)
Heparin Unfractionated: 0.44 IU/mL (ref 0.30–0.70)

## 2013-02-11 MED ORDER — APIXABAN 5 MG PO TABS
5.0000 mg | ORAL_TABLET | Freq: Two times a day (BID) | ORAL | Status: DC
  Administered 2013-02-11: 5 mg via ORAL
  Filled 2013-02-11 (×2): qty 1

## 2013-02-11 MED ORDER — PNEUMOCOCCAL VAC POLYVALENT 25 MCG/0.5ML IJ INJ: 0.5000 mL | INJECTION | INTRAMUSCULAR | Status: DC

## 2013-02-11 MED ORDER — APIXABAN 5 MG PO TABS
5.0000 mg | ORAL_TABLET | Freq: Two times a day (BID) | ORAL | Status: DC
Start: 2013-02-11 — End: 2013-03-17

## 2013-02-11 MED ORDER — INFLUENZA VAC SPLIT QUAD 0.5 ML IM SUSP
0.5000 mL | Freq: Once | INTRAMUSCULAR | Status: AC
  Administered 2013-02-11: 0.5 mL via INTRAMUSCULAR
  Filled 2013-02-11: qty 0.5

## 2013-02-11 MED ORDER — INFLUENZA VAC SPLIT QUAD 0.5 ML IM SUSP: 0.5000 mL | INTRAMUSCULAR | Status: DC

## 2013-02-11 MED ORDER — PNEUMOCOCCAL VAC POLYVALENT 25 MCG/0.5ML IJ INJ
0.5000 mL | INJECTION | Freq: Once | INTRAMUSCULAR | Status: AC
  Administered 2013-02-11: 0.5 mL via INTRAMUSCULAR
  Filled 2013-02-11: qty 0.5

## 2013-02-11 MED ORDER — DILTIAZEM HCL ER COATED BEADS 240 MG PO CP24
240.0000 mg | ORAL_CAPSULE | Freq: Every day | ORAL | Status: DC
  Administered 2013-02-11: 240 mg via ORAL
  Filled 2013-02-11: qty 1

## 2013-02-11 MED ORDER — DILTIAZEM HCL ER COATED BEADS 240 MG PO CP24: 240.0000 mg | ORAL_CAPSULE | Freq: Every day | ORAL | Status: DC

## 2013-02-11 NOTE — Progress Notes (Signed)
Echocardiogram 2D Echocardiogram has been performed.  Joelene Millin 02/11/2013, 3:28 PM

## 2013-02-11 NOTE — Progress Notes (Signed)
ANTICOAGULATION CONSULT NOTE - Follow Up Consult  Pharmacy Consult for heparin Indication: atrial fibrillation  Labs:  Recent Labs  02/10/13 1405 02/10/13 1510 02/10/13 1809 02/11/13 0040 02/11/13 0242  HGB 15.4  --   --   --  16.6  HCT 45.7  --   --   --  47.0  PLT 165  --   --   --  185  LABPROT  --  13.3  --   --   --   INR  --  1.03  --   --   --   HEPARINUNFRC  --   --   --   --  0.44  CREATININE 1.12  --   --   --  1.07  TROPONINI  --   --  <0.30 <0.30  --     Assessment/Plan:  63yo male therapeutic on heparin with initial dosing for Afib.  Will continue gtt at current rate and confirm stable with additional level.  Wynona Neat, PharmD, BCPS  02/11/2013,3:57 AM

## 2013-02-11 NOTE — Discharge Summary (Signed)
Discharge Summary   Patient ID: Damon Krueger MRN: [id number], DOB/AGE: 61/21/1952 61 y.o. Admit date: 02/10/2013 D/C date:     02/11/2013  Primary Care Provider: Gennette Pac, MD Primary Cardiologist: New to [place] this admission  Primary Discharge Diagnoses:  1. Atrial fibrillation, newly diagnosed - rate controlled, placed on apixaban 2. Viral upper respiratory infection 3. Diabetes mellitus 4. Hypertension 5. Abnormal UA - tr Hgb  Secondary Discharge Diagnoses:  1. Prostate CA s/p surgery  Hospital Course: 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 He has no prior cardiac history or family history of cardiac related issues. He presented to his PCP's office yesterday with complaints of flu-like symptoms for 7 days, including nasal congestion, non-productive cough and body aches. He reports taking OTC cold and flu medicines for 4 days without relief. At Dr. Lennette Bihari Little's office, flu test was negative. However, ECG revealed afib with RVR which was a new diagnosis. He denied CP, palpitations, chest discomfort, SOB, fever, chills, n/v/d, dizziness, syncope/ near syncope, resting or exertional angina. He exercises on a treadmill 3 days a week without limitations. He drinks 2 cups of coffee per day and is a social drinker but no recent alcohol intake. He was sent to the ER from Dr. Eddie Dibbles office and placed on cardizem drip with improvement in HR. CXR was without signs of heart failure or infection. It showed R basilar atelectasis, eventration of R anterior diaphragm, and upper limit normal heart size. WBC was normal and he was afebrile. He was admitted for further management of atrial fib. He was placed on IV heparin for anticoagulation given CHADSVASC of at least 2. His respiratory symptoms were felt likely a viral URI and supportive care was recommended. Troponins have  remained negative and he is not hypoxic. He remains in afib today but is rate-controlled. Cardizem was changed to oral form. Amlodipine and Micardis HCT were discontinued given this addition tor regimen. He will continue on ACEI. The patient elected NOAC in the form of Eliquis 5mg  BID for anticoagulation. Dr. Stanford Breed felt that recent viral illness may have caused atrial fibrillation. 2D echocardiogram demonstrated EF 50-55%, mild LVH, mod LV dilitation, mildly dilated LA. The patient is without CP or SOB today. Dr. Stanford Breed has seen and examined the patient today and feels he is stable for discharge. He will follow up with Dr. Acie Fredrickson in about 3 weeks. If he is still in AF at that time, would plan elective cardioversion 4 weeks after initiation of apixaban. I have also plugged him into blood thinner clinic for education visit in 4 weeks (03/11/13).   Of note, UA showed trace Hgb. He was instructed to f/u PCP for repeat UA to ensure clearing of blood. He denied any urinary sx but does have history of prostate cancer. He was also instructed to speak to local pharmacist when selecting OTC cold medicine given his afib and to avoid decongestant/pseudoephedrine/sudafed products.  Discharge Vitals: Blood pressure 128/74, pulse 86, temperature 98.3 F (36.8 C), temperature source Oral, resp. rate 26, height 5\' 11"  (1.803 m), weight 212 lb 1.3 oz (96.2 kg), SpO2 94.00%.  Labs: Lab Results  Component Value Date   WBC 10.3 02/11/2013   HGB 16.6 02/11/2013   HCT 47.0 02/11/2013   MCV 92.3 02/11/2013   PLT 185 02/11/2013     Recent Labs Lab 02/10/13 1405 02/11/13 0242  NA 142 142  K 4.1 3.8  CL 103 102  CO2 23 25  BUN 22 17  CREATININE 1.12 1.07  CALCIUM 9.2 9.3  PROT 7.3  --   BILITOT 0.9  --   ALKPHOS 60  --   ALT 25  --   AST 18  --   GLUCOSE 224* 173*    Recent Labs  02/10/13 1809 02/11/13 0040 02/11/13 0613  TROPONINI <0.30 <0.30 <0.30    Diagnostic Studies/Procedures   Dg Chest 2  View  02/11/2013   CLINICAL DATA:  Abnormal portable chest x-ray, cough  EXAM: CHEST  2 VIEW  COMPARISON:  02/10/2013  FINDINGS: Upper normal heart size.  Mediastinal contours and pulmonary vascularity normal.  Bowel interposition between liver and right diaphragm.  Eventration anterior right diaphragm.  Right basilar atelectasis anteriorly.  Remaining lungs clear.  No pleural effusion, pneumothorax or acute infiltrate identified.  Bones unremarkable.  IMPRESSION: Anterior right basilar atelectasis.   Electronically Signed   By: Lavonia Dana M.D.   On: 02/11/2013 14:36   Dg Chest Port 1 View  02/10/2013   CLINICAL DATA:  New onset atrial fibrillation  EXAM: PORTABLE CHEST - 1 VIEW  COMPARISON:  Portable chest x-ray dated March 19, 2010.  FINDINGS: The lungs are well-expanded. There is no focal infiltrate. There are coarse infrahilar lung markings on the right partially obscuring the medial cardio phrenic angle. The cardiopericardial silhouette is top-normal in size. The pulmonary vascularity is not engorged. The mediastinum is normal in width. There is mild tortuosity of the descending thoracic aorta. There is no pleural effusion or pneumothorax. No pulmonary parenchymal mass is demonstrated.  IMPRESSION: 1. There is no evidence of CHF. 2. Increased lung markings in the right infrahilar region may reflect subsegmental atelectasis. When the patient can tolerate the procedure, a PA and lateral chest x-ray would be of value. .   Electronically Signed   By: David  Martinique   On: 02/10/2013 14:04    Discharge Medications     Medication List    STOP taking these medications       amLODipine 5 MG tablet  Commonly known as:  NORVASC     aspirin EC 81 MG tablet     telmisartan-hydrochlorothiazide 80-12.5 MG per tablet  Commonly known as:  MICARDIS HCT      TAKE these medications       apixaban 5 MG Tabs tablet  Commonly known as:  ELIQUIS  Take 1 tablet (5 mg total) by mouth 2 (two) times daily.      diltiazem 240 MG 24 hr capsule  Commonly known as:  CARDIZEM CD  Take 1 capsule (240 mg total) by mouth daily.     enalapril 20 MG tablet  Commonly known as:  VASOTEC  Take 20 mg by mouth 2 (two) times daily.     metFORMIN 500 MG tablet  Commonly known as:  GLUCOPHAGE  Take 500 mg by mouth 2 (two) times daily with a meal.     OVER THE COUNTER MEDICATION  Take 2 capsules by mouth 2 (two) times daily as needed (cold symptoms). Multi Cold Symptom Relief.        Disposition   The patient will be discharged in stable condition to home. Discharge Orders   Future Appointments Provider Department Dept Phone   03/03/2013 8:50 AM Blair Dolphin Jorene Minors [place] Office [phone]   03/11/2013 8:30 AM Aris [place], [facility]  [phone]   Future Orders Complete By Expires   Diet - low sodium heart healthy  As directed    Discharge instructions  As directed    Comments:     When selecting over the counter cold medicine, talk to your local pharmacist. We recommend that you avoid cold medicines with decongestants like pseudoephedrine/Sudafed.   Increase activity slowly  As directed    Scheduling Instructions:     You may return to work as long as you are feeling well.     Follow-up Information   Follow up with Richardson Dopp, PA-C. ([facility] - 03/03/13 at 8:50am)    Specialty:  Physician Assistant   Contact information:   [id number] [place] [address] [phone]       Follow up with [facility]. (03/11/13 at 8:30am - Blood Thinner Education Visit - For patients on the newer blood thinners, we like to have you come in for a check-in visit in about 4 weeks to make sure you aren't having any complications.)    Specialty:  Cardiology   Contact information:   [address] [place] [address] [place] [address] [phone]      Follow up with Gennette Pac, MD. (Your urinalysis showed  microscopic trace blood. Please follow up with primary care doctor for a recheck.)    Specialty:  Family Medicine   Contact information:   [place] [address] [phone]         Duration of Discharge Encounter: Greater than 30 minutes including physician and PA time.  Signed, Melina Copa PA-C 02/11/2013, 3:57 PM

## 2013-02-11 NOTE — Progress Notes (Signed)
Inpatient Diabetes Program Recommendations  AACE/ADA: New Consensus Statement on Inpatient Glycemic Control (2013)  Target Ranges:  Prepandial:   less than 140 mg/dL      Peak postprandial:   less than 180 mg/dL (1-2 hours)      Critically ill patients:  140 - 180 mg/dL   Reason for Visit: Results for Damon Krueger, Damon Krueger (MRN [id number]) as of 02/11/2013 12:25  Ref. Range 02/10/2013 14:05 02/11/2013 02:42  Glucose Latest Range: 70-99 mg/dL 224 (H) 173 (H)   Note history of diabetes.  Please consider adding Novolog moderate correction and check A1C.   Thanks, Adah Perl, RN, BC-ADM Inpatient Diabetes Coordinator Pager [phone]

## 2013-02-11 NOTE — Progress Notes (Signed)
D/C ing to home, spouse at bedside, both given d/c instructions and prescriptions - both verbalize understanding of instructions

## 2013-02-11 NOTE — Care Management Note (Signed)
    Page 1 of 1   02/11/2013     4:56:57 PM   CARE MANAGEMENT NOTE 02/11/2013  Patient:  Damon Krueger, Damon Krueger   Account Number:  [id number]  Date Initiated:  02/11/2013  Documentation initiated by:  Marvetta Gibbons  Subjective/Objective Assessment:   Pt admitted with afib,     Action/Plan:   PTA pt lived at home with spouse- anticipate return home when medically stable   Anticipated DC Date:  02/13/2013   Anticipated DC Plan:  [place]  CM consult  Medication Assistance      Choice offered to / List presented to:             Status of service:  In process, will continue to follow Medicare Important Message given?   (If response is "NO", the following Medicare IM given date fields will be blank) Date Medicare IM given:   Date Additional Medicare IM given:    Discharge Disposition:    Per UR Regulation:  Reviewed for med. necessity/level of care/duration of stay  If discussed at [place] of Stay Meetings, dates discussed:    Comments:  02/11/13- 1645- Marvetta Gibbons RN, BSN [phone] Referral received for Eliquis benefits check- per benefits check- pt is covered with no pre-auth needed- pt's copay would be $64/30 day supply using in network pharmacies. - In to speak with pt at bedside- benefits information given to pt- per pt he uses [facility]- call made drug is in stock- pt also uses mail order.  Eliquis cards given to pt for 30 day free- (will need script with no refills to use with card) and also copay card given to pt to use with script with refills as pt is not medicare so should be able to use copay card to help reduce the cost of medication- pt understands he will need to call the # on the card to activate and see if he qualifies.  Bedside RN aware of need for Script with no refills to use with 30 day free card and will f/u with MD to get for pt prior to d/c

## 2013-02-11 NOTE — Progress Notes (Addendum)
    Subjective:  Denies CP or dyspnea   Objective:  Filed Vitals:   02/10/13 2340 02/11/13 0319 02/11/13 0400 02/11/13 0726  BP: 142/92 131/80  126/87  Pulse: 70 77  69  Temp: 98.5 F (36.9 C)  98.5 F (36.9 C) 98.1 F (36.7 C)  TempSrc: Oral  Oral Oral  Resp: 25 27  34  Height: 5\' 11"  (1.803 m)     Weight: 212 lb 1.3 oz (96.2 kg)     SpO2: 95% 96%  97%    Intake/Output from previous day:  Intake/Output Summary (Last 24 hours) at 02/11/13 1017 Last data filed at 02/11/13 0804  Gross per 24 hour  Intake    424 ml  Output   1025 ml  Net   -601 ml    Physical Exam: Physical exam: Well-developed well-nourished in no acute distress.  Skin is warm and dry.  HEENT is normal.  Neck is supple.  Chest is clear to auscultation with normal expansion.  Cardiovascular exam is irregular Abdominal exam nontender or distended. No masses palpated. Extremities show no edema. neuro grossly intact    Lab Results: Basic Metabolic Panel:  Recent Labs  02/10/13 1405 02/11/13 0242  NA 142 142  K 4.1 3.8  CL 103 102  CO2 23 25  GLUCOSE 224* 173*  BUN 22 17  CREATININE 1.12 1.07  CALCIUM 9.2 9.3  MG 1.8  --    CBC:  Recent Labs  02/10/13 1405 02/11/13 0242  WBC 9.1 10.3  NEUTROABS 6.5  --   HGB 15.4 16.6  HCT 45.7 47.0  MCV 90.9 92.3  PLT 165 185   Cardiac Enzymes:  Recent Labs  02/10/13 1809 02/11/13 0040 02/11/13 0613  TROPONINI <0.30 <0.30 <0.30     Assessment/Plan:  1 atrial fibrillation-the patient remains in atrial fibrillation today. His rate is improved. TSH is normal. Await results of echocardiogram. Change to Cardizem by mouth. Discontinue heparin. Begin apixaban 5 BID. He has embolic risk factors of hypertension and diabetes mellitus. Recent viral illness may have caused atrial fibrillation. If LV function normal and heart rate controlled with ambulation will discharge later this evening. Followup with Dr. Acie Fredrickson in 3-4 weeks. If atrial  fibrillation persists we will plan elective cardioversion in 4 weeks after initiation of apixaban. 2 diabetes mellitus-continue preadmission medications. 3 hypertension- discontinue ARB/HCTZ given the addition of Cardizem. Continue ACE inhibitor. Follow blood pressure and adjust as needed. > 30 min PA and physician time D2 Repeat chest xray prior to DC for ? Abnormality on portable Kirk Ruths 02/11/2013, 10:17 AM

## 2013-02-11 NOTE — Discharge Instructions (Signed)
Information on my medicine - ELIQUIS (apixaban)  This medication education was reviewed with me or my healthcare representative as part of my discharge preparation.  The pharmacist that spoke with me during my hospital stay was:  [facility], Margot Chimes, [facility]  Why was Eliquis prescribed for you? Eliquis was prescribed for you to reduce the risk of forming blood clots that can cause a stroke if you have a medical condition called atrial fibrillation (a type of irregular heartbeat) OR to reduce the risk of a blood clots forming after orthopedic surgery.  What do You need to know about Eliquis ? Take your Eliquis TWICE DAILY - one tablet in the morning and one tablet in the evening with or without food.  It would be best to take the doses about the same time each day.  If you have difficulty swallowing the tablet whole please discuss with your pharmacist how to take the medication safely.  Take Eliquis exactly as prescribed by your doctor and DO NOT stop taking Eliquis without talking to the doctor who prescribed the medication.  Stopping may increase your risk of developing a new clot or stroke.  Refill your prescription before you run out.  After discharge, you should have regular check-up appointments with your healthcare provider that is prescribing your Eliquis.  In the future your dose may need to be changed if your kidney function or weight changes by a significant amount or as you get older.  What do you do if you miss a dose? If you miss a dose, take it as soon as you remember on the same day and resume taking twice daily.  Do not take more than one dose of ELIQUIS at the same time.  Important Safety Information A possible side effect of Eliquis is bleeding. You should call your healthcare provider right away if you experience any of the following:   Bleeding from an injury or your nose that does not stop.   Unusual colored urine (red or dark brown) or unusual colored stools  (red or black).   Unusual bruising for unknown reasons.   A serious fall or if you hit your head (even if there is no bleeding).  Some medicines may interact with Eliquis and might increase your risk of bleeding or clotting while on Eliquis. To help avoid this, consult your healthcare provider or pharmacist prior to using any new prescription or non-prescription medications, including herbals, vitamins, non-steroidal anti-inflammatory drugs (NSAIDs) and supplements.  This website has more information on Eliquis (apixaban): [email or web address].no.

## 2013-02-12 NOTE — Discharge Summary (Signed)
See progress notes Katiya Fike  

## 2013-02-16 ENCOUNTER — Encounter: Payer: Self-pay | Admitting: *Deleted

## 2013-02-23 ENCOUNTER — Encounter: Payer: BC Managed Care – PPO | Admitting: Physician Assistant

## 2013-03-03 ENCOUNTER — Encounter: Payer: BC Managed Care – PPO | Admitting: Physician Assistant

## 2013-03-09 ENCOUNTER — Ambulatory Visit (INDEPENDENT_AMBULATORY_CARE_PROVIDER_SITE_OTHER): Payer: BC Managed Care – PPO | Admitting: Pharmacist

## 2013-03-09 ENCOUNTER — Encounter: Payer: Self-pay | Admitting: Physician Assistant

## 2013-03-09 ENCOUNTER — Telehealth: Payer: Self-pay | Admitting: *Deleted

## 2013-03-09 ENCOUNTER — Ambulatory Visit: Payer: BC Managed Care – PPO | Admitting: Pharmacist

## 2013-03-09 ENCOUNTER — Encounter: Payer: Self-pay | Admitting: *Deleted

## 2013-03-09 ENCOUNTER — Ambulatory Visit (INDEPENDENT_AMBULATORY_CARE_PROVIDER_SITE_OTHER): Payer: BC Managed Care – PPO | Admitting: Physician Assistant

## 2013-03-09 VITALS — BP 169/93 | HR 107 | Ht 71.0 in | Wt 290.0 lb

## 2013-03-09 DIAGNOSIS — I4891 Unspecified atrial fibrillation: Secondary | ICD-10-CM

## 2013-03-09 DIAGNOSIS — I10 Essential (primary) hypertension: Secondary | ICD-10-CM

## 2013-03-09 LAB — CBC WITH DIFFERENTIAL/PLATELET
BASOS PCT: 0.3 % (ref 0.0–3.0)
Basophils Absolute: 0 10*3/uL (ref 0.0–0.1)
EOS PCT: 5.6 % — AB (ref 0.0–5.0)
Eosinophils Absolute: 0.4 10*3/uL (ref 0.0–0.7)
HEMATOCRIT: 48.6 % (ref 39.0–52.0)
Hemoglobin: 15.6 g/dL (ref 13.0–17.0)
Lymphocytes Relative: 28.3 % (ref 12.0–46.0)
Lymphs Abs: 2.1 10*3/uL (ref 0.7–4.0)
MCHC: 32.1 g/dL (ref 30.0–36.0)
MCV: 96.3 fl (ref 78.0–100.0)
MONO ABS: 0.6 10*3/uL (ref 0.1–1.0)
Monocytes Relative: 8.1 % (ref 3.0–12.0)
NEUTROS ABS: 4.3 10*3/uL (ref 1.4–7.7)
NEUTROS PCT: 57.7 % (ref 43.0–77.0)
Platelets: 166 10*3/uL (ref 150.0–400.0)
RBC: 5.05 Mil/uL (ref 4.22–5.81)
RDW: 15.8 % — ABNORMAL HIGH (ref 11.5–14.6)
WBC: 7.4 10*3/uL (ref 4.5–10.5)

## 2013-03-09 LAB — BASIC METABOLIC PANEL
BUN: 22 mg/dL (ref 6–23)
CO2: 25 mEq/L (ref 19–32)
Calcium: 9.4 mg/dL (ref 8.4–10.5)
Chloride: 106 mEq/L (ref 96–112)
Creatinine, Ser: 1.3 mg/dL (ref 0.4–1.5)
GFR: 71.05 mL/min (ref >60.00)
Glucose, Bld: 187 mg/dL — ABNORMAL HIGH (ref 70–99)
Potassium: 3.9 mEq/L (ref 3.5–5.1)
Sodium: 138 mEq/L (ref 135–145)

## 2013-03-09 LAB — PROTIME-INR
INR: 1.3 ratio — AB (ref 0.8–1.0)
Prothrombin Time: 13.2 s — ABNORMAL HIGH (ref 10.2–12.4)

## 2013-03-09 MED ORDER — DILTIAZEM HCL 120 MG PO TABS: 120.0000 mg | ORAL_TABLET | Freq: Every day | ORAL | Status: DC

## 2013-03-09 NOTE — Telephone Encounter (Signed)
lmptcb about diltiazem 120 mg being added to diltiazem 240 mg daily; he will take 360 mg daily with the exception 03/18/13 when he has his DCCV he will only take diltiazem 240 mg that morning.

## 2013-03-09 NOTE — Progress Notes (Signed)
[address], [place], [place]  [address] Phone: [phone] Fax:  [phone]  Date:  03/09/2013   ID:  Damon Krueger, Damon Krueger [birth year], MRN [id number]  PCP:  Gennette Pac, MD  Cardiologist:  Dr. Liam Rogers     History of Present Illness: Damon Krueger is a 64 y.o. 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  He was recently admitted 1/7-1/8 with AFib with RVR in the setting of URI (Flu negative).  He was placed on CCB and Eliquis (CHADS2-VASc=2).  CEs were negative.  Echo (02/11/2013):  Mild LVH, EF 50-55%, mild LAE.  Plan was to proceed with DCCV if he did not convert to NSR on his own (? If viral illness resulted in AFib).    Since d/c, he is doing well.  The patient denies chest pain, shortness of breath, syncope, orthopnea, PND or significant pedal edema.   Recent Labs: 02/10/2013: ALT 25; TSH 1.396  02/11/2013: Creatinine 1.07; Hemoglobin 16.6; Potassium 3.8   Wt Readings from Last 3 Encounters:  03/09/13 290 lb (131.543 kg)  02/10/13 212 lb 1.3 oz (96.2 kg)     Past Medical History  Diagnosis Date  . Diabetes mellitus   . Hypertension   . Prostate cancer   . A-fib     a. Dx 02/2013. Placed on Cardizem, Eliquis.  . Abnormal urinalysis     a. Tr Hgb by UA 02/2013 - instructed to f/u PCP.    Current Outpatient Prescriptions  Medication Sig Dispense Refill  . ACCU-CHEK SOFTCLIX LANCETS lancets       . apixaban (ELIQUIS) 5 MG TABS tablet Take 1 tablet (5 mg total) by mouth 2 (two) times daily.  60 tablet  3  . atorvastatin (LIPITOR) 40 MG tablet Take 40 mg by mouth daily.       [name] diltiazem (CARDIZEM CD) 240 MG 24 hr capsule Take 1 capsule (240 mg total) by mouth daily.  30 capsule  6  . enalapril (VASOTEC) 20 MG tablet Take 20 mg by mouth 2 (two) times daily.      . metFORMIN (GLUCOPHAGE) 500 MG tablet Take 500 mg by mouth 2 (two) times daily with a meal.      . OVER THE COUNTER MEDICATION Take 2 capsules by mouth 2 (two)  times daily as needed (cold symptoms). Multi Cold Symptom Relief.      . tamsulosin (FLOMAX) 0.4 MG CAPS capsule Take 0.4 mg by mouth daily.        No current facility-administered medications for this visit.    Allergies:   Review of patient's allergies indicates no known allergies.   Social History:  The patient  reports that he has never smoked. He does not have any smokeless tobacco history on file. He reports that he drinks alcohol. He reports that he does not use illicit drugs.   Family History:  The patient's family history includes Hypertension in an other family member.   ROS:  Please see the history of present illness.      All other systems reviewed and negative.   PHYSICAL EXAM: VS:  BP 169/93  Pulse 107  Ht 5\' 11"  (1.803 m)  Wt 290 lb (131.543 kg)  BMI 40.46 kg/m2 Well nourished, well developed, in no acute distress HEENT: normal Neck: no JVD Cardiac:  normal S1, S2; irregularly irregular rhythm; no murmur Lungs:  clear to auscultation bilaterally, no wheezing, rhonchi or rales Abd:  soft, nontender, no hepatomegaly Ext: no edema Skin: warm and dry Neuro:  CNs 2-12 intact, no focal abnormalities noted  EKG:  Atrial fibrillation, HR 107     ASSESSMENT AND PLAN:  1. Atrial Fibrillation: Patient remains in atrial fibrillation with RVR. He has been on Eliquis 5 mg twice a day for greater than 3 weeks. I discussed the indications and benefits of proceeding with DCCV.  I reviewed his case today with Dr. Lovena Le who agreed. The patient agrees to proceed with cardioversion. This will be arranged in the near future. He understands the importance of uninterrupted anticoagulation.  Of note, the patient did ask to review this with his wife to decide on the best timing. If we cannot get him scheduled in the next 1 week, I will increase his Cardizem to 360 mg daily for better rate control. 2. Hypertension: Uncontrolled. He tells me that his blood pressures look much better at home.  Continue to monitor. Adjust diltiazem if needed. Otherwise, consider resuming HCTZ after cardioversion. 3. Diabetes: Follow up with primary care 4. Disposition: Follow up with Dr. Acie Fredrickson or me in 3-4 weeks.  Signed, Richardson Dopp, PA-C  03/09/2013 10:21 AM    Addendum:  DCCV not scheduled until 03/18/2013.  Will increase Diltiazem to 360 QD.  He will take 240 on day of DCCV. Signed,  Richardson Dopp, PA-C   03/09/2013 5:32 PM

## 2013-03-09 NOTE — Progress Notes (Signed)
Pt was started on Eliquis for AFIB on 02/11/13.  Reviewed patients medication list.  Pt is not currently on any combined P-gp and strong CYP3A4 inhibitors/inducers (ketoconazole, traconazole, ritonavir, carbamazepine, phenytoin, rifampin, St. John's wort).  Reviewed labs.  SCr 1.3, Weight 210 lbs, CrCl- 4mL/min.  Dose appropriate based on CrCl.   Hgb and HCT Within Normal Limits  A full discussion of the nature of anticoagulants has been carried out.  A benefit/risk analysis has been presented to the patient, so that they understand the justification for choosing anticoagulation with Eliquis at this time.  The need for compliance is stressed.  Pt is aware to take the medication twice daily.  Side effects of potential bleeding are discussed, including unusual colored urine or stools, coughing up blood or coffee ground emesis, nose bleeds or serious fall or head trauma.  Discussed signs and symptoms of stroke. The patient should avoid any OTC items containing aspirin or ibuprofen.  Avoid alcohol consumption.   Call if any signs of abnormal bleeding.  Discussed financial obligations and resolved any difficulty in obtaining medication.  Next lab test test in 6 months.

## 2013-03-09 NOTE — H&P (Signed)
History and Physical    Date:  03/09/2013   ID:  Kamron, Vanwyhe [birth year], MRN [id number]  PCP:  Gennette Pac, MD  Cardiologist:  Dr. 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  He was recently admitted 1/7-1/8 with AFib with RVR in the setting of URI (Flu negative).  He was placed on CCB and Eliquis (CHADS2-VASc=2).  CEs were negative.  Echo (02/11/2013):  Mild LVH, EF 50-55%, mild LAE.  Plan was to proceed with DCCV if he did not convert to NSR on his own (? If viral illness resulted in AFib).    Since d/c, he is doing well.  The patient denies chest pain, shortness of breath, syncope, orthopnea, PND or significant pedal edema.   Recent Labs: 02/10/2013: ALT 25; TSH 1.396  02/11/2013: Creatinine 1.07; Hemoglobin 16.6; Potassium 3.8   Wt Readings from Last 3 Encounters:  03/09/13 290 lb (131.543 kg)  02/10/13 212 lb 1.3 oz (96.2 kg)     Past Medical History  Diagnosis Date  . Diabetes mellitus   . Hypertension   . Prostate cancer   . A-fib     a. Dx 02/2013. Placed on Cardizem, Eliquis.  . Abnormal urinalysis     a. Tr Hgb by UA 02/2013 - instructed to f/u PCP.    Current Outpatient Prescriptions  Medication Sig Dispense Refill  . ACCU-CHEK SOFTCLIX LANCETS lancets       . apixaban (ELIQUIS) 5 MG TABS tablet Take 1 tablet (5 mg total) by mouth 2 (two) times daily.  60 tablet  3  . atorvastatin (LIPITOR) 40 MG tablet Take 40 mg by mouth daily.       [name] diltiazem (CARDIZEM CD) 240 MG 24 hr capsule Take 1 capsule (240 mg total) by mouth daily.  30 capsule  6  . enalapril (VASOTEC) 20 MG tablet Take 20 mg by mouth 2 (two) times daily.      . metFORMIN (GLUCOPHAGE) 500 MG tablet Take 500 mg by mouth 2 (two) times daily with a meal.      . OVER THE COUNTER MEDICATION Take 2 capsules by mouth 2 (two) times daily as needed (cold symptoms). Multi Cold Symptom Relief.       . tamsulosin (FLOMAX) 0.4 MG CAPS capsule Take 0.4 mg by mouth daily.        No current facility-administered medications for this visit.    Allergies:   Review of patient's allergies indicates no known allergies.   Social History:  The patient  reports that he has never smoked. He does not have any smokeless tobacco history on file. He reports that he drinks alcohol. He reports that he does not use illicit drugs.   Family History:  The patient's family history includes Hypertension in an other family member.   ROS:  Please see the history of present illness.      All other systems reviewed and negative.   PHYSICAL EXAM: VS:  BP 169/93  Pulse 107  Ht 5\' 11"  (1.803 m)  Wt 290 lb (131.543 kg)  BMI 40.46 kg/m2 Well nourished, well developed, in no acute distress HEENT: normal Neck: no JVD Cardiac:  normal S1, S2; irregularly irregular rhythm; no murmur Lungs:  clear to auscultation bilaterally, no wheezing, rhonchi or rales Abd: soft, nontender, no hepatomegaly Ext: no edema Skin: warm and dry Neuro:  CNs 2-12 intact, no focal abnormalities noted  EKG:  Atrial fibrillation, HR 107     ASSESSMENT AND PLAN:  1. Atrial Fibrillation: Patient remains in atrial fibrillation with RVR. He has been on Eliquis 5 mg twice a day for greater than 3 weeks. I discussed the indications and benefits of proceeding with DCCV.  I reviewed his case today with Dr. Lovena Le who agreed. The patient agrees to proceed with cardioversion. This will be arranged in the near future. He understands the importance of uninterrupted anticoagulation.  Of note, the patient did ask to review this with his wife to decide on the best timing. If we cannot get him scheduled in the next 1 week, I will increase his Cardizem to 360 mg daily for better rate control. 2. Hypertension: Uncontrolled. He tells me that his blood pressures look much better at home. Continue to monitor. Adjust diltiazem if needed. Otherwise, consider  resuming HCTZ after cardioversion. 3. Diabetes: Follow up with primary care 4. Disposition: Follow up with Dr. Acie Fredrickson or me in 3-4 weeks.  Signed, Richardson Dopp, PA-C  03/09/2013 10:21 AM    Addendum:  DCCV not scheduled until 03/18/2013.  Will increase Diltiazem to 360 QD.  He will take 240 on day of DCCV. Signed,  Richardson Dopp, PA-C   03/09/2013 5:32 PM

## 2013-03-09 NOTE — Patient Instructions (Addendum)
Your physician has recommended that you have a Cardioversion (DCCV) 03/18/13 @ 10 AM SEE INSTRUCTIONS SHEET. Electrical Cardioversion uses a jolt of electricity to your heart either through paddles or wired patches attached to your chest. This is a controlled, usually prescheduled, procedure. Defibrillation is done under light anesthesia in the hospital, and you usually go home the day of the procedure. This is done to get your heart back into a normal rhythm. You are not awake for the procedure. Please see the instruction sheet given to you today.   LAB WORK TODAY; BMET, CBC W/DIFF, PT/INR

## 2013-03-10 ENCOUNTER — Telehealth: Payer: Self-pay | Admitting: *Deleted

## 2013-03-10 NOTE — Telephone Encounter (Signed)
Patient states that he is returning Carol's call from yesterday concerning lab results. Please call bach at [phone]. Thanks, MI

## 2013-03-10 NOTE — Telephone Encounter (Signed)
Follow Up  Pt is returning call for results// Please call [phone]

## 2013-03-10 NOTE — Telephone Encounter (Signed)
Michae Kava, CMA at 03/09/2013 6:40 PM    Status: Signed        lmptcb about diltiazem 120 mg being added to diltiazem 240 mg daily; he will take 360 mg daily with the exception 03/18/13 when he has his DCCV he will only take diltiazem 240 mg that morning.

## 2013-03-10 NOTE — Telephone Encounter (Signed)
Reviewed additional med / pt made aware it is at the pharm to pick up. Reviewed lab results. Pt was able to verbalize how to take med and when to decrease.

## 2013-03-11 ENCOUNTER — Ambulatory Visit: Payer: BC Managed Care – PPO | Admitting: Pharmacist

## 2013-03-11 ENCOUNTER — Other Ambulatory Visit: Payer: Self-pay

## 2013-03-11 MED ORDER — DILTIAZEM HCL ER COATED BEADS 240 MG PO CP24: 240.0000 mg | ORAL_CAPSULE | Freq: Every day | ORAL | Status: DC

## 2013-03-15 ENCOUNTER — Telehealth: Payer: Self-pay | Admitting: *Deleted

## 2013-03-15 NOTE — Telephone Encounter (Signed)
pt called and wanted ot make sure he was told correctly about the diltiazem the day of DCCV. I said he is to only take diltiazem 240 mg the day of the DCCV, labs were ok . Pt verbalized understanding.

## 2013-03-17 ENCOUNTER — Encounter (HOSPITAL_COMMUNITY): Payer: Self-pay | Admitting: Pharmacy Technician

## 2013-03-18 ENCOUNTER — Encounter (HOSPITAL_COMMUNITY)
Admission: RE | Disposition: A | Discharge status: Home / Self Care | Payer: Self-pay | Source: Ambulatory Visit | Attending: Cardiology

## 2013-03-18 ENCOUNTER — Encounter (HOSPITAL_COMMUNITY): Payer: Self-pay | Admitting: Certified Registered Nurse Anesthetist

## 2013-03-18 ENCOUNTER — Encounter (HOSPITAL_COMMUNITY): Payer: BC Managed Care – PPO | Admitting: Anesthesiology

## 2013-03-18 ENCOUNTER — Ambulatory Visit (HOSPITAL_COMMUNITY)
Admission: RE | Admit: 2013-03-18 | Discharge: 2013-03-18 | Disposition: A | Discharge status: Home / Self Care | Payer: BC Managed Care – PPO | Source: Ambulatory Visit | Attending: Cardiology | Admitting: Cardiology

## 2013-03-18 ENCOUNTER — Ambulatory Visit (HOSPITAL_COMMUNITY): Payer: BC Managed Care – PPO | Admitting: Anesthesiology

## 2013-03-18 DIAGNOSIS — I10 Essential (primary) hypertension: Secondary | ICD-10-CM | POA: Insufficient documentation

## 2013-03-18 DIAGNOSIS — I4891 Unspecified atrial fibrillation: Secondary | ICD-10-CM | POA: Diagnosis present

## 2013-03-18 DIAGNOSIS — G4730 Sleep apnea, unspecified: Secondary | ICD-10-CM | POA: Insufficient documentation

## 2013-03-18 DIAGNOSIS — E119 Type 2 diabetes mellitus without complications: Secondary | ICD-10-CM | POA: Insufficient documentation

## 2013-03-18 DIAGNOSIS — Z8546 Personal history of malignant neoplasm of prostate: Secondary | ICD-10-CM | POA: Insufficient documentation

## 2013-03-18 DIAGNOSIS — Z7901 Long term (current) use of anticoagulants: Secondary | ICD-10-CM | POA: Insufficient documentation

## 2013-03-18 HISTORY — PX: CARDIOVERSION: SHX1299

## 2013-03-18 SURGERY — CARDIOVERSION
Anesthesia: General

## 2013-03-18 MED ORDER — HYDROCORTISONE 1 % EX CREA
1.0000 | TOPICAL_CREAM | Freq: Three times a day (TID) | CUTANEOUS | Status: DC | PRN
Start: 2013-03-18 — End: 2013-03-18
  Filled 2013-03-18: qty 28

## 2013-03-18 MED ORDER — SODIUM CHLORIDE 0.9 % IV SOLN
INTRAVENOUS | Status: DC | PRN
  Administered 2013-03-18: 10:00:00 via INTRAVENOUS

## 2013-03-18 MED ORDER — LIDOCAINE HCL (CARDIAC) 20 MG/ML IV SOLN
INTRAVENOUS | Status: DC | PRN
  Administered 2013-03-18: 40 mg via INTRAVENOUS

## 2013-03-18 MED ORDER — PROPOFOL 10 MG/ML IV BOLUS
INTRAVENOUS | Status: DC | PRN
  Administered 2013-03-18: 100 mg via INTRAVENOUS
  Administered 2013-03-18: 50 mg via INTRAVENOUS
  Administered 2013-03-18: 30 mg via INTRAVENOUS

## 2013-03-18 NOTE — CV Procedure (Addendum)
    Electrical Cardioversion Procedure Note Damon Krueger [id number] 1951/01/20  Procedure: Electrical Cardioversion Indications:  Atrial Fibrillation  Time Out: Verified patient identification, verified procedure,medications/allergies/relevent history reviewed, required imaging and test results available.  Performed  Procedure Details  The patient was NPO after midnight. Anesthesia was administered at the beside  by Dr.Kasix with 100mg  of propofol.  Cardioversion was performed with synchronized biphasic defibrillation via AP pads with 120,150,200,200 joules.  4 attempt(s) were performed (last attempt with towel compression of chest wall).  The patient converted to normal sinus rhythm briefly but each time reverted by to AFIB. The patient tolerated the procedure well   IMPRESSION:  Unsuccessful cardioversion of atrial fibrillation.  Will discuss with primary cardiology team.    Candee Furbish 03/18/2013, 10:13 AM   Addendum: Spoke to he and wife. Updated on procedure and fact that he remains in afib. Discussed potential approaches of rate control vs. Attempt at rhythm control with anti-arrhythmic medication and reatempt at cardioversion. Wife was disappointed.   Addendum: When sitting up had mild chest wall discomfort at CV pad site. No significant erythema. Ordered Hydrocortisone cream. Tylenol. Avoid NSAID. Explained also MSK tension following cardioversion. Has appt tomorrow.

## 2013-03-18 NOTE — Interval H&P Note (Signed)
History and Physical Interval Note:  03/18/2013 10:01 AM  Damon Krueger  has presented today for surgery, with the diagnosis of AFIB  The various methods of treatment have been discussed with the patient and family. After consideration of risks, benefits and other options for treatment, the patient has consented to  Procedure(s): CARDIOVERSION (N/A) as a surgical intervention .  The patient's history has been reviewed, patient examined, no change in status, stable for surgery.  I have reviewed the patient's chart and labs.  Questions were answered to the patient's satisfaction.     SKAINS, MARK

## 2013-03-18 NOTE — Progress Notes (Signed)
C/o pain to left upper chest with pain scale of 4, DR skains made aware ,he came in and talk to patient, hydrcortisone cream applied as ordered . Its ok to discharge patient home.

## 2013-03-18 NOTE — Anesthesia Preprocedure Evaluation (Signed)
Anesthesia Evaluation  Patient identified by MRN, date of birth, ID band Patient awake    Reviewed: Allergy & Precautions, H&P , NPO status , Patient's Chart, lab work & pertinent test results  Airway Mallampati: II TM Distance: <3 FB Neck ROM: Full    Dental   Pulmonary sleep apnea ,  breath sounds clear to auscultation        Cardiovascular hypertension, Rhythm:Irregular Rate:Normal     Neuro/Psych    GI/Hepatic   Endo/Other  diabetesMorbid obesity  Renal/GU      Musculoskeletal   Abdominal (+) + obese,   Peds  Hematology   Anesthesia Other Findings OSA Sx-no w/u  Reproductive/Obstetrics                           Anesthesia Physical Anesthesia Plan  ASA: III  Anesthesia Plan: General   Post-op Pain Management:    Induction: Intravenous  Airway Management Planned: Mask  Additional Equipment:   Intra-op Plan:   Post-operative Plan:   Informed Consent: I have reviewed the patients History and Physical, chart, labs and discussed the procedure including the risks, benefits and alternatives for the proposed anesthesia with the patient or authorized representative who has indicated his/her understanding and acceptance.     Plan Discussed with: CRNA and Surgeon  Anesthesia Plan Comments:         Anesthesia Quick Evaluation

## 2013-03-18 NOTE — Addendum Note (Signed)
Addendum created 03/18/13 1045 by Rogers Blocker, CRNA   Modules edited: Anesthesia Flowsheet

## 2013-03-18 NOTE — Discharge Instructions (Signed)
Atrial Fibrillation Atrial fibrillation is a type of irregular heart rhythm (arrhythmia). During atrial fibrillation, the upper chambers of the heart (atria) quiver continuously in a chaotic pattern. This causes an irregular and often rapid heart rate.  Atrial fibrillation is the result of the heart becoming overloaded with disorganized signals that tell it to beat. These signals are normally released one at a time by a part of the right atrium called the sinoatrial node. They then travel from the atria to the lower chambers of the heart (ventricles), causing the atria and ventricles to contract and pump blood as they pass. In atrial fibrillation, parts of the atria outside of the sinoatrial node also release these signals. This results in two problems. First, the atria receive so many signals that they do not have time to fully contract. Second, the ventricles, which can only receive one signal at a time, beat irregularly and out of rhythm with the atria.  There are three types of atrial fibrillation:   Paroxysmal Paroxysmal atrial fibrillation starts suddenly and stops on its own within a week.   Persistent Persistent atrial fibrillation lasts for more than a week. It may stop on its own or with treatment.   Permanent Permanent atrial fibrillation does not go away. Episodes of atrial fibrillation may lead to permanent atrial fibrillation.  Atrial fibrillation can prevent your heart from pumping blood normally. It increases your risk of stroke and can lead to heart failure.  CAUSES   Heart conditions, including a heart attack, heart failure, coronary artery disease, and heart valve conditions.   Inflammation of the sac that surrounds the heart (pericarditis).   Blockage of an artery in the lungs (pulmonary embolism).   Pneumonia or other infections.   Chronic lung disease.   Thyroid problems, especially if the thyroid is overactive (hyperthyroidism).   Caffeine, excessive alcohol  use, and use of some illegal drugs.   Use of some medications, including certain decongestants and diet pills.   Heart surgery.   Birth defects.  Sometimes, no cause can be found. When this happens, the atrial fibrillation is called lone atrial fibrillation. The risk of complications from atrial fibrillation increases if you have lone atrial fibrillation and you are age 61 years or older. RISK FACTORS  Heart failure.  Coronary artery disease  Diabetes mellitus.   High blood pressure (hypertension).   Obesity.   Other arrhythmias.   Increased age. SYMPTOMS   A feeling that your heart is beating rapidly or irregularly.   A feeling of discomfort or pain in your chest.   Shortness of breath.   Sudden lightheadedness or weakness.   Getting tired easily when exercising.   Urinating more often than normal (mainly when atrial fibrillation first begins).  In paroxysmal atrial fibrillation, symptoms may start and suddenly stop. DIAGNOSIS  Your caregiver may be able to detect atrial fibrillation when taking your pulse. Usually, testing is needed to diagnosis atrial fibrillation. Tests may include:   Electrocardiography. During this test, the electrical impulses of your heart are recorded while you are lying down.   Echocardiography. During echocardiography, sound waves are used to evaluate how blood flows through your heart.   Stress test. There is more than one type of stress test. If a stress test is needed, ask your caregiver about which type is best for you.   Chest X-ray exam.   Blood tests.   Computed tomography (CT).  TREATMENT   Treating any underlying conditions. For example, if you have an overactive  thyroid, treating the condition may correct atrial fibrillation.   Medication. Medications may be given to control a rapid heart rate or to prevent blood clots, heart failure, or a stroke.   Procedure to correct the rhythm of the  heart:  Electrical cardioversion. During electrical cardioversion, a controlled, low-energy shock is delivered to the heart through your skin. If you have chest pain, very low pressure blood pressure, or sudden heart failure, this procedure may need to be done as an emergency.  Catheter ablation. During this procedure, heart tissues that send the signals that cause atrial fibrillation are destroyed.  Maze or minimaze procedure. During this surgery, thin lines of heart tissue that carry the abnormal signals are destroyed. The maze procedure is an open-heart surgery. The minimaze procedure is a minimally invasive surgery. This means that small cuts are made to access the heart instead of a large opening.  Pulmonary venous isolation. During this surgery, tissue around the veins that carry blood from the lungs (pulmonary veins) is destroyed. This tissue is thought to carry the abnormal signals. HOME CARE INSTRUCTIONS   Take medications as directed by your caregiver.  Only take medications that your caregiver approves. Some medications can make atrial fibrillation worse or recur.  If blood thinners were prescribed by your caregiver, take them exactly as directed. Too much can cause bleeding. Too little and you will not have the needed protection against stroke and other problems.  Perform blood tests at home if directed by your caregiver.  Perform blood tests exactly as directed.   Quit smoking if you smoke.   Do not drink alcohol.   Do not drink caffeinated beverages such as coffee, soda, and some teas. You may drink decaffeinated coffee, soda, or tea.   Maintain a healthy weight. Do not use diet pills unless your caregiver approves. They may make heart problems worse.   Follow diet instructions as directed by your caregiver.   Exercise regularly as directed by your caregiver.   Keep all follow-up appointments. PREVENTION  The following substances can cause atrial fibrillation  to recur:   Caffeinated beverages.   Alcohol.   Certain medications, especially those used for breathing problems.   Certain herbs and herbal medications, such as those containing ephedra or ginseng.  Illegal drugs such as cocaine and amphetamines. Sometimes medications are given to prevent atrial fibrillation from recurring. Proper treatment of any underlying condition is also important in helping prevent recurrence.  SEEK MEDICAL CARE IF:  You notice a change in the rate, rhythm, or strength of your heartbeat.   You suddenly begin urinating more frequently.   You tire more easily when exerting yourself or exercising.  SEEK IMMEDIATE MEDICAL CARE IF:   You develop chest pain, abdominal pain, sweating, or weakness.  You feel sick to your stomach (nauseous).  You develop shortness of breath.  You suddenly develop swollen feet and ankles.  You feel dizzy.  You face or limbs feel numb or weak.  There is a change in your vision or speech. MAKE SURE YOU:   Understand these instructions.  Will watch your condition.  Will get help right away if you are not doing well or get worse. Document Released: 01/21/2005 Document Revised: 05/18/2012 Document Reviewed: 03/03/2012 [facility] Patient Information 2014 [place]. Patient will benefit from ongoing skilled PT services in {setting:3041680} to continue to advance safe functional mobility, address ongoing impairments in ***, and minimize fall risk.Electrical Cardioversion, Care After Refer to this sheet in the next few weeks.  These instructions provide you with information on caring for yourself after your procedure. Your health care provider may also give you more specific instructions. Your treatment has been planned according to current medical practices, but problems sometimes occur. Call your health care provider if you have any problems or questions after your procedure. WHAT TO EXPECT AFTER THE PROCEDURE After your  procedure, it is typical to have the following sensations:  Some redness on the skin where the shocks were delivered. If this is tender, a sunburn lotion or hydrocortisone cream may help.  Possible return of an abnormal heart rhythm within hours or days after the procedure. HOME CARE INSTRUCTIONS  Only take medicine as directed by your health care provider. Be sure you understand how and when to take your medicine.  Learn how to feel your pulse and check it often.  Limit your activity for 48 hours after the procedure or as directed.  Avoid or minimize caffeine and other stimulants as directed. SEEK MEDICAL CARE IF:  You feel like your heart is beating too fast or your pulse is not regular.  You have any questions about your medicines.  You have bleeding that will not stop. SEEK IMMEDIATE MEDICAL CARE IF:  You are dizzy or feel faint.  It is hard to breathe or you feel short of breath.  There is a change in discomfort in your chest.  Your speech is slurred or you have trouble moving an arm or leg on one side of your body.  You get a serious muscle cramp that does not go away.  Your fingers or toes turn cold or blue. MAKE SURE YOU:   Understand these instructions.   Will watch your condition.   Will get help right away if you are not doing well or get worse. Document Released: 11/11/2012 Document Reviewed: 08/05/2012 [facility] Patient Information 2014 [place], [place].

## 2013-03-18 NOTE — Preoperative (Signed)
Beta Blockers   Reason not to administer Beta Blockers:Not Applicable 

## 2013-03-18 NOTE — Transfer of Care (Signed)
Immediate Anesthesia Transfer of Care Note  Patient: Damon Krueger  Procedure(s) Performed: Procedure(s): CARDIOVERSION (N/A)  Patient Location: Endoscopy Unit  Anesthesia Type:MAC  Level of Consciousness: awake and patient cooperative  Airway & Oxygen Therapy: Patient Spontanous Breathing and Patient connected to nasal cannula oxygen  Post-op Assessment: Report given to PACU RN and Post -op Vital signs reviewed and stable  Post vital signs: Reviewed and stable  Complications: No apparent anesthesia complications

## 2013-03-18 NOTE — Anesthesia Postprocedure Evaluation (Signed)
  Anesthesia Post-op Note  Patient: Damon Krueger  Procedure(s) Performed: Procedure(s): CARDIOVERSION (N/A)  Patient Location: PACU and Endoscopy Unit  Anesthesia Type:General  Level of Consciousness: awake  Airway and Oxygen Therapy: Patient Spontanous Breathing  Post-op Pain: none  Post-op Assessment: Post-op Vital signs reviewed, Patient's Cardiovascular Status Stable, Respiratory Function Stable, Patent Airway, No signs of Nausea or vomiting and Pain level controlled  Post-op Vital Signs: Reviewed and stable  Complications: No apparent anesthesia complications

## 2013-03-18 NOTE — Addendum Note (Signed)
Addendum created 03/18/13 1203 by Rogers Blocker, CRNA   Modules edited: Anesthesia Medication Administration

## 2013-03-18 NOTE — Anesthesia Postprocedure Evaluation (Signed)
  Anesthesia Post-op Note  Patient: Damon Krueger  Procedure(s) Performed: Procedure(s): CARDIOVERSION (N/A)  Patient Location: Endoscopy Unit  Anesthesia Type:MAC  Level of Consciousness: awake and patient cooperative  Airway and Oxygen Therapy: Patient Spontanous Breathing and Patient connected to nasal cannula oxygen  Post-op Pain: none  Post-op Assessment: Post-op Vital signs reviewed, Patient's Cardiovascular Status Stable, Respiratory Function Stable, Patent Airway and No signs of Nausea or vomiting  Post-op Vital Signs: Reviewed and stable  Complications: No apparent anesthesia complications

## 2013-03-19 ENCOUNTER — Encounter (HOSPITAL_COMMUNITY): Payer: Self-pay | Admitting: Cardiology

## 2013-03-19 ENCOUNTER — Ambulatory Visit (INDEPENDENT_AMBULATORY_CARE_PROVIDER_SITE_OTHER): Payer: BC Managed Care – PPO | Admitting: Cardiovascular Disease

## 2013-03-19 VITALS — BP 134/70 | HR 50 | Ht 71.0 in | Wt 210.8 lb

## 2013-03-19 DIAGNOSIS — I4891 Unspecified atrial fibrillation: Secondary | ICD-10-CM

## 2013-03-19 DIAGNOSIS — I999 Unspecified disorder of circulatory system: Secondary | ICD-10-CM

## 2013-03-19 DIAGNOSIS — Z5181 Encounter for therapeutic drug level monitoring: Secondary | ICD-10-CM

## 2013-03-19 DIAGNOSIS — I998 Other disorder of circulatory system: Secondary | ICD-10-CM

## 2013-03-19 DIAGNOSIS — Z79899 Other long term (current) drug therapy: Secondary | ICD-10-CM

## 2013-03-19 MED ORDER — DILTIAZEM HCL ER COATED BEADS 240 MG PO CP24: 240.0000 mg | ORAL_CAPSULE | Freq: Every day | ORAL | Status: DC

## 2013-03-19 MED ORDER — FLECAINIDE ACETATE 100 MG PO TABS
100.0000 mg | ORAL_TABLET | Freq: Two times a day (BID) | ORAL | Status: DC
Start: 2013-03-19 — End: 2013-06-24

## 2013-03-19 NOTE — Assessment & Plan Note (Signed)
Unfortunatley, he did not convert with DCCV alone. His LA is mildly enlarged.  Will try Flecainide 100 BID, decrease Dilt to 240 mg  Will have him do a GXT myoview in 2 weeks. I will see him in 1 month with ECG Will attempt repeat Cardioversion at that time.

## 2013-03-19 NOTE — Patient Instructions (Signed)
Your physician has requested that you have en exercise stress myoview (WITHIN A WEEK). Please follow instruction sheet, as given.  Your physician recommends that you schedule a follow-up appointment in: [place]  Your physician has recommended you make the following change in your medication:   DECREASE YOUR DILTIAZEM 240 MG ONCE A DAY START FLECAINIDE 100 MG TWICE A DAY (TWELVE HOURS APART)

## 2013-03-19 NOTE — Progress Notes (Addendum)
[address], [place], [place]  [address] Phone: [phone] Fax:  [phone]   Problem List: 1. Atrial fib   Date:  03/19/2013   ID:  Journee, Damon Krueger, MRN [id number]  PCP:  Gennette Pac, MD  Cardiologist:  Dr. Liam Rogers     History of Present Illness: Damon Krueger is a 63 y.o. male [place] business professor at [employer] with a hx of HTN, DM, prostate CA.  He was recently admitted 1/7-1/8 with AFib with RVR in the setting of URI (Flu negative).  He was placed on CCB and Eliquis (CHADS2-VASc=2).  CEs were negative.  Echo (02/11/2013):  Mild LVH, EF 50-55%, mild LAE.  Plan was to proceed with DCCV if he did not convert to NSR on his own (? If viral illness resulted in AFib).    Feb. 13, 2015: He underwent cardioversion but unfortunately, he did not convert to sinus rhythm.  His Cardizem was increased to 360 mg a day. He feels well. He's completely asymptomatic. He cannot tell that his heart rate is beating irregularly or not.  Recent Labs:   Wt Readings from Last 3 Encounters:  03/19/13 210 lb 12.8 oz (95.618 kg)  03/18/13 195 lb (88.451 kg)  03/18/13 195 lb (88.451 kg)     Past Medical History  Diagnosis Date  . Diabetes mellitus   . Hypertension   . Prostate cancer   . A-fib     a. Dx 02/2013. Placed on Cardizem, Eliquis.  . Abnormal urinalysis     a. Tr Hgb by UA 02/2013 - instructed to f/u PCP.    Current Outpatient Prescriptions  Medication Sig Dispense Refill  . apixaban (ELIQUIS) 5 MG TABS tablet Take 5 mg by mouth 2 (two) times daily.      [name] atorvastatin (LIPITOR) 40 MG tablet Take 40 mg by mouth daily.       [name] diltiazem (CARDIZEM CD) 240 MG 24 hr capsule Take 240 mg by mouth daily. *takes with diltiazem 120mg  tablet for 360mg  dose*      . diltiazem (CARDIZEM) 120 MG tablet Take 120 mg by mouth daily. *takes with diltiazem 240mg  capsule for 360mg  dose*      . enalapril (VASOTEC) 20 MG tablet Take 20 mg by mouth 2 (two) times  daily.      . metFORMIN (GLUCOPHAGE) 500 MG tablet Take 500 mg by mouth 2 (two) times daily with a meal.      . tamsulosin (FLOMAX) 0.4 MG CAPS capsule Take 0.4 mg by mouth daily as needed (for urinary frequency).       No current facility-administered medications for this visit.    Allergies:   Review of patient's allergies indicates no known allergies.   Social History:  The patient  reports that he has never smoked. He does not have any smokeless tobacco history on file. He reports that he drinks alcohol. He reports that he does not use illicit drugs.   Family History:  The patient's family history includes Hypertension in an other family member.   ROS:  Please see the history of present illness.      All other systems reviewed and negative.   PHYSICAL EXAM: VS:  BP 134/70  Pulse 50  Ht 5\' 11"  (1.803 m)  Wt 210 lb 12.8 oz (95.618 kg)  BMI 29.41 kg/m2 Well nourished, well developed, in no acute distress HEENT: normal Neck: no JVD Cardiac:  normal S1, S2; irregularly irregular rhythm;  no murmur Lungs:  clear to auscultation bilaterally, no wheezing, rhonchi or rales Abd: soft, nontender, no hepatomegaly Ext: no edema Skin: warm and dry Neuro:  CNs 2-12 intact, no focal abnormalities noted  EKG:  Atrial fibrillation, HR 107     ASSESSMENT AND PLAN 1.

## 2013-03-25 ENCOUNTER — Encounter (HOSPITAL_COMMUNITY): Payer: BC Managed Care – PPO

## 2013-04-02 ENCOUNTER — Telehealth: Payer: Self-pay | Admitting: Cardiovascular Disease

## 2013-04-02 DIAGNOSIS — I10 Essential (primary) hypertension: Secondary | ICD-10-CM

## 2013-04-02 MED ORDER — HYDROCHLOROTHIAZIDE 25 MG PO TABS
25.0000 mg | ORAL_TABLET | Freq: Every day | ORAL | Status: DC
Start: 2013-04-02 — End: 2014-03-18

## 2013-04-02 MED ORDER — POTASSIUM CHLORIDE CRYS ER 10 MEQ PO TBCR: 10.0000 meq | EXTENDED_RELEASE_TABLET | Freq: Every day | ORAL | Status: DC

## 2013-04-02 NOTE — Telephone Encounter (Signed)
Pt C/O HTN x 4-5 days. I reviewed meds/ pt has been taking as prescribed, I reviewed sodium intake and he has not been eating a lot. BP 150-160//90-95 P  68-84 Pt currently on diltiazem 240 mg and enalapril 20 mg BID. Please advise.

## 2013-04-02 NOTE — Telephone Encounter (Signed)
New message     C/o bp is high---medication has been changed a couple of time.  Have stress test scheduled for next week. Need advise

## 2013-04-02 NOTE — Telephone Encounter (Signed)
Lets add HCTZ 25 a day and Kdur 10 a day. Check BMP in 3 weeks

## 2013-04-02 NOTE — Telephone Encounter (Signed)
New meds reviewed/ pt will have bmet on 1 month OV/ I reviewed sodium/ caffeine/ need for regular exercise with pt, he verbalized understanding.

## 2013-04-03 ENCOUNTER — Telehealth: Payer: Self-pay | Admitting: Internal Medicine

## 2013-04-03 NOTE — Telephone Encounter (Signed)
I was contacted by [name], who is concerned that his blood pressure is still elevated.  He reports it being 175/105 this evening.  This morning the SBP was 130.  The patient spoke with Dr. Acie Fredrickson yesterday, who added HCTZ.  The patient is currently asymptomatic and took his evening dose of enalapril 20 mg about 1 hour ago.  I advised [name] that it can take several weeks for HCTZ to reach its maximal effectiveness and that an isolated BP reading of 175/105 without symptoms is not an acute concern.  I advised him to recheck his blood pressure later this evening.  If it is above 180/110, he should seek urgent medical attention.  Otherwise, he should continue his current medication regimen and follow-up with Dr. Acie Fredrickson as previously arranged.

## 2013-04-06 ENCOUNTER — Ambulatory Visit (HOSPITAL_COMMUNITY): Payer: BC Managed Care – PPO | Attending: Cardiology | Admitting: Radiology

## 2013-04-06 VITALS — BP 152/102 | HR 72 | Ht 71.0 in | Wt 206.0 lb

## 2013-04-06 DIAGNOSIS — R0609 Other forms of dyspnea: Secondary | ICD-10-CM | POA: Insufficient documentation

## 2013-04-06 DIAGNOSIS — E119 Type 2 diabetes mellitus without complications: Secondary | ICD-10-CM | POA: Insufficient documentation

## 2013-04-06 DIAGNOSIS — R0602 Shortness of breath: Secondary | ICD-10-CM

## 2013-04-06 DIAGNOSIS — I998 Other disorder of circulatory system: Secondary | ICD-10-CM

## 2013-04-06 DIAGNOSIS — Z79899 Other long term (current) drug therapy: Secondary | ICD-10-CM

## 2013-04-06 DIAGNOSIS — I10 Essential (primary) hypertension: Secondary | ICD-10-CM | POA: Insufficient documentation

## 2013-04-06 DIAGNOSIS — R0989 Other specified symptoms and signs involving the circulatory and respiratory systems: Principal | ICD-10-CM | POA: Insufficient documentation

## 2013-04-06 DIAGNOSIS — I4891 Unspecified atrial fibrillation: Secondary | ICD-10-CM | POA: Insufficient documentation

## 2013-04-06 DIAGNOSIS — Z5181 Encounter for therapeutic drug level monitoring: Secondary | ICD-10-CM

## 2013-04-06 MED ORDER — TECHNETIUM TC 99M SESTAMIBI GENERIC - CARDIOLITE
30.0000 | Freq: Once | INTRAVENOUS | Status: AC | PRN
  Administered 2013-04-06: 30 via INTRAVENOUS

## 2013-04-06 MED ORDER — TECHNETIUM TC 99M SESTAMIBI GENERIC - CARDIOLITE
10.0000 | Freq: Once | INTRAVENOUS | Status: AC | PRN
  Administered 2013-04-06: 10 via INTRAVENOUS

## 2013-04-06 NOTE — Progress Notes (Signed)
[facility] [address] [place], [place] [address] [phone]    Cardiology Nuclear Med Study  Damon Krueger is a 63 y.o. male     MRN : [id number]     DOB: 05/17/1950  Procedure Date: 04/06/2013  Nuclear Med Background Indication for Stress Test:  Evaluation for Ischemia, and Assess for Ventricular Arrhythmias due to recently starting Flecainide History:  AFIB with RVR> attempted cardioversion 03-19-2013 unsuccessful> started flecainide,  No prior known history of CAD, and 02-11-2013 Echo: EF=50-55% Cardiac Risk Factors: Hypertension and NIDDM  Symptoms:  DOE   Nuclear Pre-Procedure Caffeine/Decaff Intake:  None > 12hrs NPO After: 8:30pm   Lungs:  clear O2 Sat: 95% on room air. IV 0.9% NS with Angio Cath:  22g  IV Site: R Antecubital x 1, tolerated well IV Started by:  Irven Baltimore, RN  Chest Size (in):  44 Cup Size: n/a  Height: 5\' 11"  (1.803 m)  Weight:  206 lb (93.441 kg)  BMI:  Body mass index is 28.74 kg/(m^2). Tech Comments:  Took Flecainide and Diltiazem this am , but held Metformin today.    Nuclear Med Study 1 or 2 day study: 1 day  Stress Test Type:  Stress  Reading MD: N/A  Order Authorizing Provider:  Mertie Moores, MD  Resting Radionuclide: Technetium 75m Sestamibi  Resting Radionuclide Dose: 10.8 mCi   Stress Radionuclide:  Technetium 34m Sestamibi  Stress Radionuclide Dose: 33.0 mCi           Stress Protocol Rest HR: 72 Stress HR: 151  Rest BP: 152/102 Stress BP: 191/104  Exercise Time (min): 10:00 METS: 11.7   Predicted Max HR: 160 bpm % Max HR: 94.38 bpm Rate Pressure Product: [id number]   Dose of Adenosine (mg):  n/a Dose of Lexiscan: n/a mg  Dose of Atropine (mg): n/a Dose of Dobutamine: n/a mcg/kg/min (at max HR)  Stress Test Technologist: Irven Baltimore, RN  Nuclear Technologist:  Charlton Amor, CNMT     Rest Procedure:  Myocardial perfusion imaging was performed at rest 45 minutes following the intravenous  administration of Technetium 42m Sestamibi. Rest ECG: NSR with PAC's  Stress Procedure:  The patient exercised on the treadmill utilizing the Bruce Protocol for 10:00 minutes, RPE=15. The patient stopped due to DOE and denied any chest pain.  There was a mild hypertensive response to exercise. Technetium 25m Sestamibi was injected at peak exercise and myocardial perfusion imaging was performed after a brief delay. Stress ECG: No significant change from baseline ECG  QPS Raw Data Images:  Mild diaphragmatic attenuation.  Normal left ventricular size. Stress Images:  Normal homogeneous uptake in all areas of the myocardium. Rest Images:  Normal homogeneous uptake in all areas of the myocardium. Subtraction (SDS):  No evidence of ischemia. Transient Ischemic Dilatation (Normal <1.22):  0.81 Lung/Heart Ratio (Normal <0.45):  0.33  Quantitative Gated Spect Images QGS EDV:  NA QGS ESV:  NA  Impression Exercise Capacity:  Excellent exercise capacity. BP Response:  Hypertensive blood pressure response. Clinical Symptoms:  There is dyspnea. ECG Impression:  No significant ST segment change suggestive of ischemia. Comparison with Prior Nuclear Study: No previous nuclear study performed  Overall Impression:  Normal stress nuclear study.  LV Ejection Fraction: Study not gated.  LV Wall Motion:  NA  Signed: Fransico Him, MD 04/06/2013

## 2013-04-08 ENCOUNTER — Other Ambulatory Visit: Payer: Self-pay

## 2013-04-08 MED ORDER — DILTIAZEM HCL ER COATED BEADS 240 MG PO CP24: 240.0000 mg | ORAL_CAPSULE | Freq: Every day | ORAL | Status: DC

## 2013-04-29 ENCOUNTER — Encounter: Payer: Self-pay | Admitting: Cardiovascular Disease

## 2013-04-29 ENCOUNTER — Ambulatory Visit (INDEPENDENT_AMBULATORY_CARE_PROVIDER_SITE_OTHER): Payer: BC Managed Care – PPO | Admitting: Cardiovascular Disease

## 2013-04-29 VITALS — BP 128/78 | HR 80 | Ht 71.0 in | Wt 212.0 lb

## 2013-04-29 DIAGNOSIS — I10 Essential (primary) hypertension: Secondary | ICD-10-CM

## 2013-04-29 DIAGNOSIS — Z79899 Other long term (current) drug therapy: Secondary | ICD-10-CM

## 2013-04-29 LAB — BASIC METABOLIC PANEL
BUN: 27 mg/dL — AB (ref 6–23)
CHLORIDE: 100 meq/L (ref 96–112)
CO2: 27 mEq/L (ref 19–32)
Calcium: 9.5 mg/dL (ref 8.4–10.5)
Creatinine, Ser: 1.4 mg/dL (ref 0.4–1.5)
GFR: 64.75 mL/min (ref >60.00)
Glucose, Bld: 290 mg/dL — ABNORMAL HIGH (ref 70–99)
POTASSIUM: 3.7 meq/L (ref 3.5–5.1)
Sodium: 137 mEq/L (ref 135–145)

## 2013-04-29 NOTE — Assessment & Plan Note (Signed)
His myoview was normal.  Continue Flecainide at present dose. Cont. dilt

## 2013-04-29 NOTE — Assessment & Plan Note (Addendum)
Continue with same medications. His blood pressure is well-controlled.  We'll check a basic metabolic profile today.  I'll see him again in 3 months for followup office visit as well as another BMP.

## 2013-04-29 NOTE — Patient Instructions (Signed)
Your physician recommends that you return for lab work in: today and in 3 months   Your physician recommends that you schedule a follow-up appointment in: 3 months with a bmet  Lab draw

## 2013-04-29 NOTE — Progress Notes (Signed)
[address], [place], [place]  [address] Phone: [phone] Fax:  [phone]   Problem List: 1. Atrial fib 2. HTN   Date:  04/29/2013   ID:  Damon Krueger, Damon Krueger 1950-03-28, MRN [id number]  PCP:  Gennette Pac, MD  Cardiologist:  Dr. 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  He was recently admitted 1/7-1/8 with AFib with RVR in the setting of URI (Flu negative).  He was placed on CCB and Eliquis (CHADS2-VASc=2).  CEs were negative.  Echo (02/11/2013):  Mild LVH, EF 50-55%, mild LAE.  Plan was to proceed with DCCV if he did not convert to NSR on his own (? If viral illness resulted in AFib).    Feb. 13, 2015: He underwent cardioversion but unfortunately, he did not convert to sinus rhythm. His Cardizem was increased to 360 mg a day. He feels well. He's completely asymptomatic. He cannot tell that his heart rate is beating irregularly or not.  April 29, 2013:  The patient has failed cardioversion back in February ( as noted above) . We started him on flecainide and he converted to normal sinus rhythm. A stress Myoview study was negative for ischemia. He did not have any evidence of pro-arrhythmia. He's feeling quite well. He's walking about 20 miles a week.    Wt Readings from Last 3 Encounters:  04/29/13 212 lb (96.163 kg)  04/06/13 206 lb (93.441 kg)  03/19/13 210 lb 12.8 oz (95.618 kg)     Past Medical History  Diagnosis Date  . Diabetes mellitus   . Hypertension   . Prostate cancer   . A-fib     a. Dx 02/2013. Placed on Cardizem, Eliquis.  . Abnormal urinalysis     a. Tr Hgb by UA 02/2013 - instructed to f/u PCP.    Current Outpatient Prescriptions  Medication Sig Dispense Refill  . apixaban (ELIQUIS) 5 MG TABS tablet Take 5 mg by mouth 2 (two) times daily.      [name] atorvastatin (LIPITOR) 40 MG tablet Take 40 mg by mouth daily.       [name]  COLCRYS 0.6 MG tablet       . diltiazem (CARDIZEM CD) 240 MG 24 hr capsule Take 1 capsule (240 mg total) by mouth daily.  180 capsule  1  . enalapril (VASOTEC) 20 MG tablet Take 20 mg by mouth 2 (two) times daily.      . flecainide (TAMBOCOR) 100 MG tablet Take 1 tablet (100 mg total) by mouth 2 (two) times daily.  60 tablet  3  . hydrochlorothiazide (HYDRODIURIL) 25 MG tablet Take 1 tablet (25 mg total) by mouth daily.  90 tablet  3  . metFORMIN (GLUCOPHAGE) 500 MG tablet Take 500 mg by mouth 2 (two) times daily with a meal.      . potassium chloride (K-DUR,KLOR-CON) 10 MEQ tablet Take 1 tablet (10 mEq total) by mouth daily.  90 tablet  3  . tamsulosin (FLOMAX) 0.4 MG CAPS capsule Take 0.4 mg by mouth daily as needed (for urinary frequency).       No current facility-administered medications for this visit.    Allergies:   Review of patient's allergies indicates no known allergies.   Social History:  The patient  reports that he has never smoked. He does not have any smokeless tobacco history  on file. He reports that he drinks alcohol. He reports that he does not use illicit drugs.   Family History:  The patient's family history includes Hypertension in an other family member.   ROS:  Please see the history of present illness.      All other systems reviewed and negative.   PHYSICAL EXAM: VS:  BP 128/78  Pulse 80  Ht 5\' 11"  (1.803 m)  Wt 212 lb (96.163 kg)  BMI 29.58 kg/m2 Well nourished, well developed, in no acute distress HEENT: normal Neck: no JVD Cardiac:  normal S1, S2; irregularly irregular rhythm; no murmur Lungs:  clear to auscultation bilaterally, no wheezing, rhonchi or rales Abd: soft, nontender, no hepatomegaly Ext: no edema Skin: warm and dry Neuro:  CNs 2-12 intact, no focal abnormalities noted  EKG:  Atrial fibrillation, HR 107     ASSESSMENT AND PLAN:

## 2013-06-24 ENCOUNTER — Other Ambulatory Visit: Payer: Self-pay | Admitting: *Deleted

## 2013-06-24 DIAGNOSIS — I4891 Unspecified atrial fibrillation: Secondary | ICD-10-CM

## 2013-06-24 MED ORDER — FLECAINIDE ACETATE 100 MG PO TABS: 100.0000 mg | ORAL_TABLET | Freq: Two times a day (BID) | ORAL | Status: DC

## 2013-07-05 ENCOUNTER — Telehealth: Payer: Self-pay | Admitting: Cardiovascular Disease

## 2013-07-05 NOTE — Telephone Encounter (Signed)
New message      Patient will be traveling out of the country for 3wks.  Want to check with the doctor to see if he needs to take any medication prior to travel

## 2013-07-05 NOTE — Telephone Encounter (Signed)
Spoke with patient who asked if there were precautions he needs to take before traveling out of the country for 3 weeks.  I advised patient to make certain he has enough of all of his cardiac medications to continue while he travels.  I advised patient that if he is flying for long periods of time to ambulate frequently and move legs around and to stay well hydrated.  I advised patient that for questions of precautions for traveling to certain areas of the world to contact his PCP or the public health department.  Patient verbalized understanding and agreement and verified appointment for June 10.

## 2013-07-10 ENCOUNTER — Other Ambulatory Visit (HOSPITAL_COMMUNITY): Payer: Self-pay | Admitting: Internal Medicine

## 2013-07-14 ENCOUNTER — Encounter: Payer: Self-pay | Admitting: Cardiovascular Disease

## 2013-07-14 ENCOUNTER — Encounter (INDEPENDENT_AMBULATORY_CARE_PROVIDER_SITE_OTHER): Payer: Self-pay

## 2013-07-14 ENCOUNTER — Ambulatory Visit (INDEPENDENT_AMBULATORY_CARE_PROVIDER_SITE_OTHER): Payer: BC Managed Care – PPO | Admitting: Cardiovascular Disease

## 2013-07-14 VITALS — BP 128/80 | HR 82 | Ht 71.0 in | Wt 212.0 lb

## 2013-07-14 DIAGNOSIS — I4891 Unspecified atrial fibrillation: Secondary | ICD-10-CM

## 2013-07-14 DIAGNOSIS — I10 Essential (primary) hypertension: Secondary | ICD-10-CM

## 2013-07-14 MED ORDER — METOPROLOL SUCCINATE ER 25 MG PO TB24: 25.0000 mg | ORAL_TABLET | Freq: Every day | ORAL | Status: DC

## 2013-07-14 MED ORDER — APIXABAN 5 MG PO TABS: ORAL_TABLET | ORAL | Status: DC

## 2013-07-14 NOTE — Assessment & Plan Note (Addendum)
He has maintained NSR.    Continue same medications. He seems to be tolerating them  very well.  [name]

## 2013-07-14 NOTE — Assessment & Plan Note (Signed)
BP is ok.  His HR is a bit fast. I would like to add toprol XL 25 mg a day.  he will call if he has any side effects.

## 2013-07-14 NOTE — Progress Notes (Signed)
[address], [place], [place]  [address] Phone: [phone] Fax:  [phone]   Problem List: 1. Atrial fib 2. HTN   Date:  07/14/2013   ID:  Damon Krueger, Damon Krueger 01-Sep-1950, MRN [id number]  PCP:  Gennette Pac, MD  Cardiologist:  Dr. 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  He was recently admitted 1/7-1/8 with AFib with RVR in the setting of URI (Flu negative).  He was placed on CCB and Eliquis (CHADS2-VASc=2).  CEs were negative.  Echo (02/11/2013):  Mild LVH, EF 50-55%, mild LAE.  Plan was to proceed with DCCV if he did not convert to NSR on his own (? If viral illness resulted in AFib).    Feb. 13, 2015: He underwent cardioversion but unfortunately, he did not convert to sinus rhythm. His Cardizem was increased to 360 mg a day. He feels well. He's completely asymptomatic. He cannot tell that his heart rate is beating irregularly or not.  April 29, 2013:  The patient has failed cardioversion back in February ( as noted above) . We started him on flecainide and he converted to normal sinus rhythm. A stress Myoview study was negative for ischemia. He did not have any evidence of pro-arrhythmia. He's feeling quite well. He's walking about 20 miles a week.  July 14, 2013:  [name] is doing well.  Still walking 20 miles a week.  He's not had any palpitations. He denies any chest pain or shortness of breath   Wt Readings from Last 3 Encounters:  07/14/13 212 lb (96.163 kg)  04/29/13 212 lb (96.163 kg)  04/06/13 206 lb (93.441 kg)     Past Medical History  Diagnosis Date  . Diabetes mellitus   . Hypertension   . Prostate cancer   . A-fib     a. Dx 02/2013. Placed on Cardizem, Eliquis.  . Abnormal urinalysis     a. Tr Hgb by UA 02/2013 - instructed to f/u PCP.    Current Outpatient Prescriptions  Medication Sig Dispense Refill  . atorvastatin  (LIPITOR) 40 MG tablet Take 40 mg by mouth daily.       [name] COLCRYS 0.6 MG tablet       . diltiazem (CARDIZEM CD) 240 MG 24 hr capsule Take 1 capsule (240 mg total) by mouth daily.  180 capsule  1  . ELIQUIS 5 MG TABS tablet TAKE 1 TABLET BY MOUTH TWICE A DAY  60 tablet  3  . enalapril (VASOTEC) 20 MG tablet Take 20 mg by mouth 2 (two) times daily.      . flecainide (TAMBOCOR) 100 MG tablet Take 1 tablet (100 mg total) by mouth 2 (two) times daily.  180 tablet  0  . hydrochlorothiazide (HYDRODIURIL) 25 MG tablet Take 1 tablet (25 mg total) by mouth daily.  90 tablet  3  . metFORMIN (GLUCOPHAGE) 500 MG tablet Take 500 mg by mouth 2 (two) times daily with a meal.      . ONETOUCH DELICA LANCETS 16S MISC       . potassium chloride (K-DUR,KLOR-CON) 10 MEQ tablet Take 1 tablet (10 mEq total) by mouth daily.  90 tablet  3  . tamsulosin (FLOMAX) 0.4 MG CAPS capsule Take 0.4 mg by mouth daily as needed (for urinary frequency).       No current facility-administered medications  for this visit.    Allergies:   Review of patient's allergies indicates no known allergies.   Social History:  The patient  reports that he has never smoked. He does not have any smokeless tobacco history on file. He reports that he drinks alcohol. He reports that he does not use illicit drugs.   Family History:  The patient's family history includes Hypertension in an other family member.   ROS:  Please see the history of present illness.      All other systems reviewed and negative.   PHYSICAL EXAM: VS:  BP 128/80  Pulse 82  Ht 5\' 11"  (1.803 m)  Wt 212 lb (96.163 kg)  BMI 29.58 kg/m2 Well nourished, well developed, in no acute distress HEENT: normal Neck: no JVD Cardiac:  normal S1, S2; irregularly irregular rhythm; no murmur Lungs:  clear to auscultation bilaterally, no wheezing, rhonchi or rales Abd: soft, nontender, no hepatomegaly Ext: no edema Skin: warm and dry Neuro:  CNs 2-12 intact, no focal abnormalities  noted  EKG:  Atrial fibrillation, HR 107     ASSESSMENT AND PLAN:

## 2013-07-14 NOTE — Patient Instructions (Addendum)
Your physician has recommended you make the following change in your medication:  1. START TOPROL XL 25 MG DAILY  Your physician recommends that you continue on your current medications as directed. Please refer to the Current Medication list given to you today.  Your physician recommends that you schedule a follow-up appointment in: [place] DR. Acie Fredrickson

## 2013-07-20 ENCOUNTER — Other Ambulatory Visit: Payer: Self-pay | Admitting: *Deleted

## 2013-07-20 DIAGNOSIS — I4891 Unspecified atrial fibrillation: Secondary | ICD-10-CM

## 2013-07-20 MED ORDER — FLECAINIDE ACETATE 100 MG PO TABS: 100.0000 mg | ORAL_TABLET | Freq: Two times a day (BID) | ORAL | Status: DC

## 2013-09-23 ENCOUNTER — Other Ambulatory Visit: Payer: Self-pay | Admitting: Cardiovascular Disease

## 2013-10-28 ENCOUNTER — Encounter: Payer: Self-pay | Admitting: Cardiovascular Disease

## 2013-10-28 ENCOUNTER — Ambulatory Visit (INDEPENDENT_AMBULATORY_CARE_PROVIDER_SITE_OTHER): Payer: BC Managed Care – PPO | Admitting: Cardiovascular Disease

## 2013-10-28 VITALS — BP 132/82 | HR 71 | Ht 71.0 in | Wt 213.0 lb

## 2013-10-28 DIAGNOSIS — I10 Essential (primary) hypertension: Secondary | ICD-10-CM

## 2013-10-28 DIAGNOSIS — I4891 Unspecified atrial fibrillation: Secondary | ICD-10-CM

## 2013-10-28 NOTE — Patient Instructions (Signed)
Your physician recommends that you continue on your current medications as directed. Please refer to the Current Medication list given to you today.  Your physician wants you to follow-up in: 1 year with Dr. Varanasi. You will receive a reminder letter in the mail two months in advance. If you don't receive a letter, please call our office to schedule the follow-up appointment.  

## 2013-10-28 NOTE — Assessment & Plan Note (Signed)
He has maintained sinus rhythm. He's not having any palpitations he's tolerating low-dose diltiazem and low-dose metoprolol without any issues. He had some discussion about changing from both of these medications to just one. In the end and after much discussion, we settled on keeping low doses of both diltiazem and metoprolol which he tolerates very well.  I will see him in 1 year.  He will get his labs sent form Dr. Eddie Dibbles office.

## 2013-10-28 NOTE — Progress Notes (Signed)
[address], [place],   [address] Phone: [phone] Fax:  [phone]   Problem List: 1. Atrial fib 2. HTN   Date:  10/28/2013   ID:  Damon Krueger, Damon Krueger [birth year], MRN [id number]  PCP:  Gennette Pac, MD  Cardiologist:  Dr. 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  He was recently admitted 1/7-1/8 with AFib with RVR in the setting of URI (Flu negative).  He was placed on CCB and Eliquis (CHADS2-VASc=2).  CEs were negative.  Echo (02/11/2013):  Mild LVH, EF 50-55%, mild LAE.  Plan was to proceed with DCCV if he did not convert to NSR on his own (? If viral illness resulted in AFib).    Feb. 13, 2015: He underwent cardioversion but unfortunately, he did not convert to sinus rhythm. His Cardizem was increased to 360 mg a day. He feels well. He's completely asymptomatic. He cannot tell that his heart rate is beating irregularly or not.  April 29, 2013:  The patient has failed cardioversion back in February ( as noted above) . We started him on flecainide and he converted to normal sinus rhythm. A stress Myoview study was negative for ischemia. He did not have any evidence of pro-arrhythmia. He's feeling quite well. He's walking about 20 miles a week.  July 14, 2013:  Damon Krueger is doing well.  Still walking 20 miles a week.  He's not had any palpitations. He denies any chest pain or shortness of breath  Sept. 24, 2015:  Damon Krueger is doing . No arrhythmias.  No CP. No dyspnea. Exercising regularly He measures his blood pressure at home on a regular basis. His typical readings were in the 110/73 range   Wt Readings from Last 3 Encounters:  10/28/13 213 lb (96.616 kg)  07/14/13 212 lb (96.163 kg)  04/29/13 212 lb (96.163 kg)     Past Medical History  Diagnosis Date  . Diabetes mellitus   . Hypertension   . Prostate cancer   . A-fib     a. Dx  02/2013. Placed on Cardizem, Eliquis.  . Abnormal urinalysis     a. Tr Hgb by UA 02/2013 - instructed to f/u PCP.    Current Outpatient Prescriptions  Medication Sig Dispense Refill  . ACCU-CHEK AVIVA PLUS test strip       . allopurinol (ZYLOPRIM) 300 MG tablet       . apixaban (ELIQUIS) 5 MG TABS tablet TAKE 1 TABLET BY MOUTH TWICE A DAY  60 tablet  3  . atorvastatin (LIPITOR) 40 MG tablet Take 40 mg by mouth daily.       [name] COLCRYS 0.6 MG tablet       . diltiazem (CARDIZEM CD) 240 MG 24 hr capsule Take 1 capsule (240 mg total) by mouth daily.  180 capsule  1  . enalapril (VASOTEC) 20 MG tablet Take 20 mg by mouth 2 (two) times daily.      . flecainide (TAMBOCOR) 100 MG tablet TAKE 1 TABLET TWICE A DAY  180 tablet  0  . hydrochlorothiazide (HYDRODIURIL) 25 MG tablet Take 1 tablet (25 mg total) by mouth daily.  90 tablet  3  . metFORMIN (GLUCOPHAGE) 500 MG tablet Take 500 mg by mouth 2 (two) times daily with a meal.      . metoprolol succinate (TOPROL XL) 25  MG 24 hr tablet Take 1 tablet (25 mg total) by mouth daily.  90 tablet  3  . ONETOUCH DELICA LANCETS 80H MISC       . potassium chloride (K-DUR,KLOR-CON) 10 MEQ tablet Take 1 tablet (10 mEq total) by mouth daily.  90 tablet  3  . tamsulosin (FLOMAX) 0.4 MG CAPS capsule Take 0.4 mg by mouth daily as needed (for urinary frequency).       No current facility-administered medications for this visit.    Allergies:   Review of patient's allergies indicates no known allergies.   Social History:  The patient  reports that he has never smoked. He does not have any smokeless tobacco history on file. He reports that he drinks alcohol. He reports that he does not use illicit drugs.   Family History:  The patient's family history includes Hypertension in an other family member.   ROS:  Please see the history of present illness.      All other systems reviewed and negative.   PHYSICAL EXAM: VS:  BP 132/82  Pulse 71  Ht 5\' 11"  (1.803 m)  Wt  213 lb (96.616 kg)  BMI 29.72 kg/m2 Well nourished, well developed, in no acute distress HEENT: normal Neck: no JVD Cardiac:  normal S1, S2; irregularly irregular rhythm; no murmur Lungs:  clear to auscultation bilaterally, no wheezing, rhonchi or rales Abd: soft, nontender, no hepatomegaly Ext: no edema Skin: warm and dry Neuro:  CNs 2-12 intact, no focal abnormalities noted  EKG:  Atrial fibrillation, HR 107     ASSESSMENT AND PLAN:

## 2013-11-10 ENCOUNTER — Encounter: Payer: Self-pay | Admitting: Gastroenterology

## 2013-11-25 ENCOUNTER — Encounter: Payer: Self-pay | Admitting: *Deleted

## 2013-12-01 ENCOUNTER — Ambulatory Visit (INDEPENDENT_AMBULATORY_CARE_PROVIDER_SITE_OTHER): Payer: BC Managed Care – PPO | Admitting: Gastroenterology

## 2013-12-01 ENCOUNTER — Encounter: Payer: Self-pay | Admitting: Gastroenterology

## 2013-12-01 ENCOUNTER — Telehealth: Payer: Self-pay | Admitting: *Deleted

## 2013-12-01 VITALS — BP 100/76 | HR 72 | Ht 71.0 in | Wt 207.6 lb

## 2013-12-01 DIAGNOSIS — Z1211 Encounter for screening for malignant neoplasm of colon: Secondary | ICD-10-CM | POA: Insufficient documentation

## 2013-12-01 DIAGNOSIS — Z7901 Long term (current) use of anticoagulants: Secondary | ICD-10-CM | POA: Insufficient documentation

## 2013-12-01 HISTORY — DX: Long term (current) use of anticoagulants: Z79.01

## 2013-12-01 HISTORY — DX: Encounter for screening for malignant neoplasm of colon: Z12.11

## 2013-12-01 MED ORDER — MOVIPREP 100 G PO SOLR: 1.0000 | Freq: Once | ORAL | Status: DC

## 2013-12-01 NOTE — Progress Notes (Signed)
Reviewed and agree with management plan.  Astoria Condon T. Sidonie Dexheimer, MD FACG 

## 2013-12-01 NOTE — Progress Notes (Signed)
12/01/2013 Damon Krueger [id number] 26-Jan-1951   HISTORY OF PRESENT ILLNESS:  Patient is a 63 year old male who is previously known to Dr. Sharlett Iles for colonoscopy in 1/20015.  At that time he was found to have only diverticulosis.  He is here today to discuss screening colonoscopy again.  He is now on Eliquis that he has been on since about 03/2013 for atrial fibrillation.  His cardiologist is Dr. Acie Fredrickson.  He denies any GI complaints.    Past Medical History  Diagnosis Date  . Diabetes mellitus   . Hypertension   . Prostate cancer   . A-fib     a. Dx 02/2013. Placed on Cardizem, Eliquis.  . Abnormal urinalysis     a. Tr Hgb by UA 02/2013 - instructed to f/u PCP.  [name] Diverticulosis    Past Surgical History  Procedure Laterality Date  . Prostate surgery  2011  . Cardioversion N/A 03/18/2013    Procedure: CARDIOVERSION;  Surgeon: Candee Furbish, MD;  Location: [facility];  Service: Cardiovascular;  Laterality: N/A;  pt shocked x4, starting with 120 joules, 150, 200 and 299 joules without successful conversion...Dr. Marlou Porch will have pt follow up with cardiologists...    reports that he has never smoked. He has never used smokeless tobacco. He reports that he drinks alcohol. He reports that he does not use illicit drugs. family history includes Hypertension in an other family member. No Known Allergies    Outpatient Encounter Prescriptions as of 12/01/2013  Medication Sig  . ACCU-CHEK AVIVA PLUS test strip   . allopurinol (ZYLOPRIM) 300 MG tablet   . apixaban (ELIQUIS) 5 MG TABS tablet TAKE 1 TABLET BY MOUTH TWICE A DAY  . atorvastatin (LIPITOR) 40 MG tablet Take 40 mg by mouth daily.   [name] COLCRYS 0.6 MG tablet   . diltiazem (CARDIZEM CD) 240 MG 24 hr capsule Take 1 capsule (240 mg total) by mouth daily.  . enalapril (VASOTEC) 20 MG tablet Take 20 mg by mouth 2 (two) times daily.  . flecainide (TAMBOCOR) 100 MG tablet TAKE 1 TABLET TWICE A DAY  . hydrochlorothiazide  (HYDRODIURIL) 25 MG tablet Take 1 tablet (25 mg total) by mouth daily.  . metFORMIN (GLUCOPHAGE) 500 MG tablet Take 500 mg by mouth 2 (two) times daily with a meal.  . metoprolol succinate (TOPROL XL) 25 MG 24 hr tablet Take 1 tablet (25 mg total) by mouth daily.  [name] DELICA LANCETS 17H MISC   . potassium chloride (K-DUR,KLOR-CON) 10 MEQ tablet Take 1 tablet (10 mEq total) by mouth daily.  . tamsulosin (FLOMAX) 0.4 MG CAPS capsule Take 0.4 mg by mouth daily as needed (for urinary frequency).     REVIEW OF SYSTEMS  : All other systems reviewed and negative except where noted in the History of Present Illness.   PHYSICAL EXAM: BP 100/76  Pulse 72  Ht 5\' 11"  (1.803 m)  Wt 207 lb 9.6 oz (94.167 kg)  BMI 28.97 kg/m2 General: Well developed black male in no acute distress Head: Normocephalic and atraumatic Eyes:  Sclerae anicteric, conjunctiva pink. Ears: Normal auditory acuity. Lungs: Clear throughout to auscultation Heart: Regular rate and rhythm Abdomen: Soft, non-distended.  Normal bowel sounds.  Non-tender. Rectal:  Deferred.  Will be done at the time of colonoscopy. Musculoskeletal: Symmetrical with no gross deformities  Skin: No lesions on visible extremities Extremities: No edema  Neurological: Alert oriented x 4, grossly non-focal Psychological:  Alert and cooperative. Normal mood and  affect  ASSESSMENT AND PLAN: -Screening colonoscopy:  Last colonoscopy 02/2003 by Dr. Sharlett Iles.  Will schedule for screening with Dr. Fuller Plan.  The risks, benefits, and alternatives were discussed with the patient and he consents to proceed. -Chronic anticoagulation with Eliquis for atrial fibrillation since about 03/2013.  The risks benefits and alternatives to a temporary hold of anti-coagulants/anti-platelets for the procedure were discussed with the patient he consents to proceed. Obtain clearance from Dr. Acie Fredrickson for ok to hold Eliquis for 24-48 hours.

## 2013-12-01 NOTE — Patient Instructions (Addendum)

## 2013-12-01 NOTE — Telephone Encounter (Signed)
Patient may hold Eliquis for 1 1/2  days prior to endoscopy procedure

## 2013-12-01 NOTE — Telephone Encounter (Signed)
  12/01/2013   RE: ONEIL BEHNEY DOB: 07-Jul-1950 MRN: [id number]   Dear Acie Fredrickson,    We have scheduled the above patient for an Colonoscopy. Our records show that he is on anticoagulation therapy.   Please advise as to how long the patient may come off his therapy of Eliquis prior to the procedure, which is scheduled for 01-17-2014.  Please route the completed form to Evette Georges., CMA   Sincerely,    Hope Pigeon

## 2013-12-02 ENCOUNTER — Encounter: Payer: Self-pay | Admitting: Gastroenterology

## 2013-12-02 NOTE — Telephone Encounter (Signed)
Called patient [facility] for patient to call back  Saturday AM--Patient takes Eliquis  Saturday PM- No  Sunday-No  Monday-No--Day of Procedure

## 2013-12-02 NOTE — Telephone Encounter (Signed)
Patient called back Patient was instructed on how to hold his Eliquis Patient verbalized understanding

## 2013-12-16 ENCOUNTER — Other Ambulatory Visit: Payer: Self-pay | Admitting: Cardiovascular Disease

## 2014-01-17 ENCOUNTER — Ambulatory Visit (AMBULATORY_SURGERY_CENTER): Payer: BC Managed Care – PPO | Admitting: Gastroenterology

## 2014-01-17 ENCOUNTER — Encounter: Payer: Self-pay | Admitting: Gastroenterology

## 2014-01-17 VITALS — BP 124/85 | HR 71 | Temp 96.9°F | Resp 16 | Ht 71.0 in | Wt 207.0 lb

## 2014-01-17 DIAGNOSIS — D123 Benign neoplasm of transverse colon: Secondary | ICD-10-CM

## 2014-01-17 DIAGNOSIS — D128 Benign neoplasm of rectum: Secondary | ICD-10-CM

## 2014-01-17 DIAGNOSIS — Z1211 Encounter for screening for malignant neoplasm of colon: Secondary | ICD-10-CM

## 2014-01-17 DIAGNOSIS — D175 Benign lipomatous neoplasm of intra-abdominal organs: Secondary | ICD-10-CM

## 2014-01-17 DIAGNOSIS — K621 Rectal polyp: Secondary | ICD-10-CM

## 2014-01-17 LAB — GLUCOSE, CAPILLARY
GLUCOSE-CAPILLARY: 119 mg/dL — AB (ref 70–99)
GLUCOSE-CAPILLARY: 113 mg/dL — AB (ref 70–99)

## 2014-01-17 MED ORDER — SODIUM CHLORIDE 0.9 % IV SOLN: 500.0000 mL | INTRAVENOUS | Status: DC

## 2014-01-17 NOTE — Progress Notes (Signed)
Pt stable to RR 

## 2014-01-17 NOTE — Patient Instructions (Signed)
HOLD ASPIRIN, ASPIRIN PRODUCTS AND NSAIDS (MOTRIN,ADVIL, ALEVE ETC) FOR TWO WEEKS, December 28,2015.  RESTART YOUR ELIQUIS IN 2 DAYS, December 17,2015.  YOU HAD AN ENDOSCOPIC PROCEDURE TODAY AT [facility]: Refer to the procedure report that was given to you for any specific questions about what was found during the examination.  If the procedure report does not answer your questions, please call your gastroenterologist to clarify.  If you requested that your care partner not be given the details of your procedure findings, then the procedure report has been included in a sealed envelope for you to review at your convenience later.  YOU SHOULD EXPECT: Some feelings of bloating in the abdomen. Passage of more gas than usual.  Walking can help get rid of the air that was put into your GI tract during the procedure and reduce the bloating. If you had a lower endoscopy (such as a colonoscopy or flexible sigmoidoscopy) you may notice spotting of blood in your stool or on the toilet paper. If you underwent a bowel prep for your procedure, then you may not have a normal bowel movement for a few days.  DIET: Your first meal following the procedure should be a light meal and then it is ok to progress to your normal diet.  A half-sandwich or bowl of soup is an example of a good first meal.  Heavy or fried foods are harder to digest and may make you feel nauseous or bloated.  Likewise meals heavy in dairy and vegetables can cause extra gas to form and this can also increase the bloating.  Drink plenty of fluids but you should avoid alcoholic beverages for 24 hours.  ACTIVITY: Your care partner should take you home directly after the procedure.  You should plan to take it easy, moving slowly for the rest of the day.  You can resume normal activity the day after the procedure however you should NOT DRIVE or use heavy machinery for 24 hours (because of the sedation medicines used during the test).     SYMPTOMS TO REPORT IMMEDIATELY: A gastroenterologist can be reached at any hour.  During normal business hours, 8:30 AM to 5:00 PM Monday through Friday, call [phone].  After hours and on weekends, please call the GI answering service at [phone] who will take a message and have the physician on call contact you.   Following lower endoscopy (colonoscopy or flexible sigmoidoscopy):  Excessive amounts of blood in the stool  Significant tenderness or worsening of abdominal pains  Swelling of the abdomen that is new, acute  Fever of 100F or higher  FOLLOW UP: If any biopsies were taken you will be contacted by phone or by letter within the next 1-3 weeks.  Call your gastroenterologist if you have not heard about the biopsies in 3 weeks.  Our staff will call the home number listed on your records the next business day following your procedure to check on you and address any questions or concerns that you may have at that time regarding the information given to you following your procedure. This is a courtesy call and so if there is no answer at the home number and we have not heard from you through the emergency physician on call, we will assume that you have returned to your regular daily activities without incident.  SIGNATURES/CONFIDENTIALITY: You and/or your care partner have signed paperwork which will be entered into your electronic medical record.  These signatures attest to the fact  that that the information above on your After Visit Summary has been reviewed and is understood.  Full responsibility of the confidentiality of this discharge information lies with you and/or your care-partner.

## 2014-01-17 NOTE — Progress Notes (Signed)
Called to room to assist during endoscopic procedure.  Patient ID and intended procedure confirmed with present staff. Received instructions for my participation in the procedure from the performing physician.  

## 2014-01-17 NOTE — Op Note (Addendum)
[place]  [address] [place], [id number]   COLONOSCOPY PROCEDURE REPORT PATIENT: Damon Krueger, Damon Krueger  MR#: [id number] BIRTHDATE: 1950/11/10 , 63  yrs. old GENDER: male ENDOSCOPIST: Ladene Artist, MD, [facility] PROCEDURE DATE:  01/17/2014 PROCEDURE:   Colonoscopy with snare polypectomy First Screening Colonoscopy - Avg.  risk and is 50 yrs.  old or older - No.  Prior Negative Screening - Now for repeat screening. 10 or more years since last screening  History of Adenoma - Now for follow-up colonoscopy & has been > or = to 3 yrs.  N/A  Polyps Removed Today? Yes. ASA CLASS:   Class III INDICATIONS:average risk for colorectal cancer. MEDICATIONS: Monitored anesthesia care and Propofol 400 mg IV DESCRIPTION OF PROCEDURE:   After the risks benefits and alternatives of the procedure were thoroughly explained, informed consent was obtained.  The digital rectal exam revealed no abnormalities of the rectum.   The LB [id number]  endoscope was introduced through the anus and advanced to the cecum, which was identified by both the appendix and ileocecal valve. No adverse events experienced with a tortuous and redundant colon.   The quality of the prep was good, using MoviPrep  The instrument was then slowly withdrawn as the colon was fully examined.  COLON FINDINGS: A pedunculated polyp measuring 10 mm in size was found in the transverse colon.  A polypectomy was performed using snare cautery.  The resection was complete, the polyp tissue was completely retrieved and sent to histology.  A pedunculated polyp measuring 8 mm in size was found in the distal rectum.  A polypectomy was performed using snare cautery.  The resection was complete, the polyp tissue was completely retrieved and sent to histology. A sessile polyp measuring 7 mm in size was found in the proximal rectum.  A polypectomy was performed with a cold snare. The resection was complete, the polyp tissue was  completely retrieved and sent to histology. There was moderate diverticulosis noted in the left colon. The examination was otherwise normal. Retroflexed views revealed internal Grade II hemorrhoids. The time to cecum=11 minutes 20 seconds.  Withdrawal time=12 minutes 03 seconds.  The scope was withdrawn and the procedure completed. COMPLICATIONS: There were no immediate complications. ENDOSCOPIC IMPRESSION: 1.   Pedunculated polyp in the transverse colon; polypectomy performed using snare cautery 2.   Pedunculated polyp in the rectum; polypectomy performed using snare cautery 3.   Sessile polyp in the rectum; polypectomy performed with a cold snare 4.   Moderate diverticulosis in the left colon 5.   Grade Il internal hemorrhoids  RECOMMENDATIONS: 1.  Hold Aspirin and all other NSAIDS for 2 weeks. 2.  Await pathology results 3.  Repeat colonoscopy in 3-5 years if polyp(s) adenomatous; otherwise 10 years 4.  Resume Eliquis in 2 days  eSigned:  Ladene Artist, MD, [facility] 01/17/2014 4:02 PM Revised: 01/17/2014 4:02 PM

## 2014-01-18 ENCOUNTER — Telehealth: Payer: Self-pay

## 2014-01-18 NOTE — Telephone Encounter (Signed)
  Follow up Call-  Call back number 01/17/2014  Post procedure Call Back phone  # [phone]  Permission to leave phone message Yes     Patient questions:  Do you have a fever, pain , or abdominal swelling? No. Pain Score  0 *  Have you tolerated food without any problems? Yes.    Have you been able to return to your normal activities? Yes.    Do you have any questions about your discharge instructions: Diet   No. Medications  No. Follow up visit  No.  Do you have questions or concerns about your Care? No.  Actions: * If pain score is 4 or above: No action needed, pain <4.  Per the pt he did have some sneezing last night.  I advised him he did have o2 in his nose from the procedure, and it possibly could cause some nasal congestion.  To call back if sx do not resolve. maw

## 2014-01-28 ENCOUNTER — Encounter: Payer: Self-pay | Admitting: Gastroenterology

## 2014-03-01 ENCOUNTER — Telehealth: Payer: Self-pay | Admitting: Gastroenterology

## 2014-03-01 NOTE — Telephone Encounter (Signed)
Patient with diarrhea and indigestion.  He will come in on 03/09/14 and see Alonza Bogus, PA. He is offered an earlier appt, but not able to come due to scheduling conflicts

## 2014-03-09 ENCOUNTER — Encounter: Payer: Self-pay | Admitting: *Deleted

## 2014-03-09 ENCOUNTER — Ambulatory Visit (INDEPENDENT_AMBULATORY_CARE_PROVIDER_SITE_OTHER): Payer: BC Managed Care – PPO | Admitting: Gastroenterology

## 2014-03-09 VITALS — BP 110/78 | HR 72 | Ht 71.0 in | Wt 212.0 lb

## 2014-03-09 DIAGNOSIS — IMO0001 Reserved for inherently not codable concepts without codable children: Secondary | ICD-10-CM

## 2014-03-09 DIAGNOSIS — R195 Other fecal abnormalities: Secondary | ICD-10-CM

## 2014-03-09 DIAGNOSIS — R143 Flatulence: Secondary | ICD-10-CM

## 2014-03-09 HISTORY — DX: Other fecal abnormalities: R19.5

## 2014-03-09 HISTORY — DX: Reserved for inherently not codable concepts without codable children: IMO0001

## 2014-03-09 NOTE — Progress Notes (Signed)
     03/09/2014 Damon Krueger [id number] [birth year]   History of Present Illness:  This is a 64 year old male who is known mostly recently to Dr. Fuller Plan for colonoscopy in 01/2014.  At that time he was found to have the following:  1.  Pedunculated polyp in the transverse colon; polypectomy performed using snare cautery; serrated adenoma 2. Pedunculated polyp in the rectum; polypectomy performed using snare cautery; lipoma 3. Sessile polyp in the rectum; polypectomy performed with a cold snare; tubular adenoma 4. Moderate diverticulosis in the left colon 5. Grade Il internal hemorrhoids  Repeat colonoscopy is recommended in 3 years.    He is here today with complaints of loose stools and gas.  He says that this has only been present for the past couple of weeks.  Has about 2 loose BM's per day.  He denies any recent travel or antibiotics, but does admit that his Metformin dose was increased within the past couple of months.  He denies any abdominal pain, blood in stools, nausea, and vomiting.  He was just concerned that maybe it had something to do with the polyps being removed and the colonoscopy.   Current Medications, Allergies, Past Medical History, Past Surgical History, Family History and Social History were reviewed in [employer] record.   Physical Exam: BP 110/78 mmHg  Pulse 72  Ht 5\' 11"  (1.803 m)  Wt 212 lb (96.163 kg)  BMI 29.58 kg/m2 General: Well developed black male in no acute distress Head: Normocephalic and atraumatic Eyes:  Sclerae anicteric, conjunctiva pink  Ears: Normal auditory acuity Lungs: Clear throughout to auscultation Heart: Regular rate and rhythm Abdomen: Soft, non-distended.  Normal bowel sounds.  Non-tender. Musculoskeletal: Symmetrical with no gross deformities  Extremities: No edema  Neurological: Alert oriented x 4, grossly non-focal Psychological:  Alert and cooperative. Normal mood and affect  Assessment and  Recommendations: -Loose stools with gas:  May be related to his recent increase in Metformin dose.  Also could be a mild gastroenteritis.  I offered to perform stool studies and some labs, however, patient declined for now.  His main concern was to make sure that it was not related to the colon polyps that he had removed during his colonoscopy in December.  I offered him reassurance that I do not think it is related.  He is going to try taking a daily probiotic to see if this helps.  Return to the office prn for ongoing symptoms.

## 2014-03-09 NOTE — Progress Notes (Signed)
Reviewed and agree with management plan.  Kadiatou Oplinger T. Rmani Kapusta, MD FACG 

## 2014-03-09 NOTE — Patient Instructions (Addendum)
Please purchase the following medications over the counter and take as directed to help with loose stools and gas: Probiotic such as Florastor, Align, restora etc..Marland KitchenMarland Kitchen

## 2014-03-18 ENCOUNTER — Other Ambulatory Visit: Payer: Self-pay | Admitting: Cardiovascular Disease

## 2014-03-18 MED ORDER — HYDROCHLOROTHIAZIDE 25 MG PO TABS: 25.0000 mg | ORAL_TABLET | Freq: Every day | ORAL | Status: DC

## 2014-03-18 MED ORDER — POTASSIUM CHLORIDE CRYS ER 10 MEQ PO TBCR: 10.0000 meq | EXTENDED_RELEASE_TABLET | Freq: Every day | ORAL | Status: DC

## 2014-03-26 ENCOUNTER — Other Ambulatory Visit: Payer: Self-pay | Admitting: Cardiovascular Disease

## 2014-03-29 ENCOUNTER — Telehealth: Payer: Self-pay | Admitting: Cardiovascular Disease

## 2014-03-29 NOTE — Telephone Encounter (Signed)
Patient called with concern that he is experiencing irregular heart rate. He is uncertain. BP "doing fine" and HR when checked is 75-80. Denies SOB, cough or CP. Patient instructed how to feel for a pulse on himself. He states that it feels regular but "maybe two times a minute it doesn't feel regular". Denies "pounding or forceful heart beats". Denies any other cardiac symptomology.  He was last seen by Dr. Acie Fredrickson in September, 2015. Patient wants to know if he needs to be sooner than one year?  Routed to Dr. Acie Fredrickson.

## 2014-03-29 NOTE — Telephone Encounter (Signed)
New Message  Pt wanted to speak w/ Rn about condition. Please call back and discuss.

## 2014-03-30 NOTE — Telephone Encounter (Signed)
These sound like PVCs which are benign.  Make sure he is avoiding excessive caffiene. If they continue, we can work him in sometime next week or so

## 2014-03-31 NOTE — Telephone Encounter (Signed)
Spoke with patient who states he is feeling better today.  Patient denies excessive caffeine intake - states he drinks 1 cup of coffee daily.  I offered patient a soon appointment; patient unable to come tomorrow but is scheduled for follow-up on 3/4 with Dr. Acie Fredrickson.  Patient verbalized understanding to call back before next Friday if his symptoms worsen or if he has any questions or concerns.

## 2014-04-08 ENCOUNTER — Ambulatory Visit (INDEPENDENT_AMBULATORY_CARE_PROVIDER_SITE_OTHER): Payer: BC Managed Care – PPO | Admitting: Cardiovascular Disease

## 2014-04-08 ENCOUNTER — Encounter: Payer: Self-pay | Admitting: Cardiovascular Disease

## 2014-04-08 VITALS — BP 150/90 | HR 62 | Ht 71.0 in | Wt 212.8 lb

## 2014-04-08 DIAGNOSIS — I4891 Unspecified atrial fibrillation: Secondary | ICD-10-CM

## 2014-04-08 DIAGNOSIS — I480 Paroxysmal atrial fibrillation: Secondary | ICD-10-CM

## 2014-04-08 DIAGNOSIS — I10 Essential (primary) hypertension: Secondary | ICD-10-CM

## 2014-04-08 MED ORDER — METOPROLOL SUCCINATE ER 50 MG PO TB24: 50.0000 mg | ORAL_TABLET | Freq: Every day | ORAL | Status: DC

## 2014-04-08 MED ORDER — FLECAINIDE ACETATE 50 MG PO TABS: ORAL_TABLET | ORAL | Status: DC

## 2014-04-08 NOTE — Patient Instructions (Addendum)
Your physician has recommended you make the following change in your medication:  INCREASE Toprol XL to 50 mg daily STOP Cartia DECREASE Flecainide to 50 mg twice daily  Your physician recommends that you have lab work:  TODAY - flecainide level   Your physician wants you to follow-up in: 6 months with Dr. Acie Fredrickson.  You will receive a reminder letter in the mail two months in advance. If you don't receive a letter, please call our office to schedule the follow-up appointment.  Your physician recommends that you schedule return for follow-up for nurse visit/EKG Monday 3/7 at 9:00 am

## 2014-04-08 NOTE — Progress Notes (Signed)
.   Cardiology Office Note   Date:  04/08/2014   ID:  Damon Krueger, DOB 1950/03/21, MRN [id number]  PCP:  Damon Pac, MD  Cardiologist:   Damon Headings, MD   Chief Complaint  Patient presents with  . Follow-up   Problem list: 1. Essential hypertension 2. Diabetes mellitus 3. Prostate cancer 4. Paroxysmal Atrial fibrillation  Damon Krueger is a 64 y.o. 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 He was recently admitted 1/7-1/8 with AFib with RVR in the setting of URI (Flu negative). He was placed on CCB and Eliquis (CHADS2-VASc=2). CEs were negative. Echo (02/11/2013): Mild LVH, EF 50-55%, mild LAE. Plan was to proceed with DCCV if he did not convert to NSR on his own (? If viral illness resulted in AFib).   Feb. 13, 2015: He underwent cardioversion but unfortunately, he did not convert to sinus rhythm. His Cardizem was increased to 360 mg a day. He feels well. He's completely asymptomatic. He cannot tell that his heart rate is beating irregularly or not.  April 29, 2013:  The patient has failed cardioversion back in February ( as noted above) . We started him on flecainide and he converted to normal sinus rhythm. A stress Myoview study was negative for ischemia. He did not have any evidence of pro-arrhythmia. He's feeling quite well. He's walking about 20 miles a week.  July 14, 2013:  Damon Krueger is doing well. Still walking 20 miles a week. He's not had any palpitations. He denies any chest pain or shortness of breath  Sept. 24, 2015:  Damon Krueger is doing . No arrhythmias. No CP. No dyspnea. Exercising regularly He measures his blood pressure at home on a regular basis. His typical readings were in the 110/73 range    April 08, 2014:   Damon Krueger is a 64 y.o. male who presents for follow-up of his atrial fibrillation and hypertension. BP is typically much better at home.  BP is a bit high tonight .   He complains of  having "floaters" in his visual field.      Past Medical History  Diagnosis Date  . Diabetes mellitus   . Hypertension   . Prostate cancer   . A-fib     a. Dx 02/2013. Placed on Cardizem, Eliquis.  . Abnormal urinalysis     a. Tr Hgb by UA 02/2013 - instructed to f/u PCP.  [name] Diverticulosis   . Tubular adenoma of colon   . Internal hemorrhoid     Past Surgical History  Procedure Laterality Date  . Prostate surgery  2011  . Cardioversion N/A 03/18/2013    Procedure: CARDIOVERSION;  Surgeon: Candee Furbish, MD;  Location: [facility] ENDOSCOPY;  Service: Cardiovascular;  Laterality: N/A;  pt shocked x4, starting with 120 joules, 150, 200 and 299 joules without successful conversion...Dr. Marlou Porch will have pt follow up with cardiologists...     Current Outpatient Prescriptions  Medication Sig Dispense Refill  . ACCU-CHEK AVIVA PLUS test strip     . allopurinol (ZYLOPRIM) 300 MG tablet Take 300 mg by mouth daily.     [name] apixaban (ELIQUIS) 5 MG TABS tablet TAKE 1 TABLET BY MOUTH TWICE A DAY 60 tablet 3  . atorvastatin (LIPITOR) 40 MG tablet Take 40 mg by mouth daily.     [name] CARTIA XT 240 MG 24 hr capsule TAKE 1 CAPSULE DAILY 180 capsule 1  . COLCRYS 0.6 MG tablet Take 0.6  mg by mouth daily.     . enalapril (VASOTEC) 20 MG tablet Take 20 mg by mouth 2 (two) times daily.    . flecainide (TAMBOCOR) 100 MG tablet TAKE 1 TABLET TWICE A DAY 180 tablet 3  . hydrochlorothiazide (HYDRODIURIL) 25 MG tablet Take 1 tablet (25 mg total) by mouth daily. 90 tablet 1  . metFORMIN (GLUCOPHAGE) 500 MG tablet Take 1,000 mg by mouth 2 (two) times daily with a meal.     . metoprolol succinate (TOPROL XL) 25 MG 24 hr tablet Take 1 tablet (25 mg total) by mouth daily. 90 tablet 3  . ONETOUCH DELICA LANCETS 25D MISC     . potassium chloride (K-DUR,KLOR-CON) 10 MEQ tablet Take 1 tablet (10 mEq total) by mouth daily. 90 tablet 1  . tamsulosin (FLOMAX) 0.4 MG CAPS capsule Take 0.4 mg by mouth daily as needed (for urinary  frequency).     No current facility-administered medications for this visit.    Allergies:   Review of patient's allergies indicates no known allergies.    Social History:  The patient  reports that he has never smoked. He has never used smokeless tobacco. He reports that he drinks alcohol. He reports that he does not use illicit drugs.   Family History:  The patient's family history includes Hypertension in an other family member. There is no history of Colon cancer, Esophageal cancer, Rectal cancer, or Stomach cancer.    ROS:  Please see the history of present illness.    Review of Systems: Constitutional:  denies fever, chills, diaphoresis, appetite change and fatigue.  HEENT: denies photophobia, eye pain, redness, hearing loss, ear pain, congestion, sore throat, rhinorrhea, sneezing, neck pain, neck stiffness and tinnitus.  Respiratory: denies SOB, DOE, cough, chest tightness, and wheezing.  Cardiovascular: denies chest pain, palpitations and leg swelling.  Gastrointestinal: denies nausea, vomiting, abdominal pain, diarrhea, constipation, blood in stool.  Genitourinary: denies dysuria, urgency, frequency, hematuria, flank pain and difficulty urinating.  Musculoskeletal: denies  myalgias, back pain, joint swelling, arthralgias and gait problem.   Skin: denies pallor, rash and wound.  Neurological: denies dizziness, seizures, syncope, weakness, light-headedness, numbness and headaches.   Hematological: denies adenopathy, easy bruising, personal or family bleeding history.  Psychiatric/ Behavioral: denies suicidal ideation, mood changes, confusion, nervousness, sleep disturbance and agitation.       All other systems are reviewed and negative.    PHYSICAL EXAM: VS:  BP 150/90 mmHg  Pulse 62  Ht 5\' 11"  (1.803 m)  Wt 212 lb 12.8 oz (96.525 kg)  BMI 29.69 kg/m2 , BMI Body mass index is 29.69 kg/(m^2). GEN: Well nourished, well developed, in no acute distress HEENT:  normal Neck: no JVD, carotid bruits, or masses Cardiac: RRR; no murmurs, rubs, or gallops,no edema  Respiratory:  clear to auscultation bilaterally, normal work of breathing GI: soft, nontender, nondistended, + BS MS: no deformity or atrophy Skin: warm and dry, no rash Neuro:  Strength and sensation are intact Psych: normal   EKG:  EKG is ordered today. The ekg ordered today demonstrates normal sinus rhythm. QRS duration is 134 ms.   Recent Labs: 04/29/2013: BUN 27*; Creatinine 1.4; Potassium 3.7; Sodium 137    Lipid Panel No results found for: CHOL, TRIG, HDL, CHOLHDL, VLDL, LDLCALC, LDLDIRECT    Wt Readings from Last 3 Encounters:  04/08/14 212 lb 12.8 oz (96.525 kg)  03/09/14 212 lb (96.163 kg)  01/17/14 207 lb (93.895 kg)      Other studies Reviewed:  Additional studies/ records that were reviewed today include: . Review of the above records demonstrates:    ASSESSMENT AND PLAN:  1.  Paroxysmal atrial fibrillation. Rilee is doing fairly well. He does describe some visual disturbances. His EKG shows a QRS duration of 134 ms. His baseline QRS duration was 94 ms. I discussed the case with Roderic Palau, nurse practitioner. She suggested that we draw Flecainide level and decrease his flecainide to 50 mg twice a day.  Will check ECG in 3 days.  At this point we will stop the diltiazem and increase his metoprolol XL to 50 mg once a day.  2. Essential hypertension: Blood pressure is a little elevated today. He states that it is normal at home.    Current medicines are reviewed at length with the patient today.  The patient does not have concerns regarding medicines.  The following changes have been made:  no change   Disposition:   FU with me in 3 months .  Check ECG in 3 day .     Signed, Nahser, Wonda Cheng, MD  04/08/2014 11:29 AM    [facility] [address], [place], [place]  [address] Phone: [phone]; Fax: [phone]

## 2014-04-11 ENCOUNTER — Encounter: Payer: Self-pay | Admitting: *Deleted

## 2014-04-11 ENCOUNTER — Ambulatory Visit (INDEPENDENT_AMBULATORY_CARE_PROVIDER_SITE_OTHER): Payer: BC Managed Care – PPO | Admitting: *Deleted

## 2014-04-11 VITALS — BP 146/100 | HR 63 | Ht 71.0 in | Wt 212.5 lb

## 2014-04-11 DIAGNOSIS — R9431 Abnormal electrocardiogram [ECG] [EKG]: Secondary | ICD-10-CM | POA: Diagnosis not present

## 2014-04-11 NOTE — Patient Instructions (Addendum)
The current medical regimen is effective;  continue present plan and medications.  Follow up as instructed (in 6 months with Dr Acie Fredrickson).  Please keep a check on your blood pressure at home and notify Dr Acie Fredrickson if it continues to be elevated.  Thank you for choosing [facility]!!

## 2014-04-11 NOTE — Progress Notes (Signed)
Pt here for repeat EKG to follow up prolonged QRS of 134 ms.  On repeat today it is 118 ms after a decrease in Flecainide to 50 mg BID.  Pt Metoprolol was increased to 50 twice a day and D/Ced [place]. Will review with Roderic Palau and Dr Rayann Heman (DOD).  1.) Reason for visit: repeat EKG  2.) Name of MD requesting visit: Nahser  3.) H&P: HX of At Fib with EKG on Friday (3/4) prolonged QRS at 134 ms - Flecainide was decreased to 50 mg BID.  Geronimo Boot was d/ced and Metoprolol was increased to 50 mg a day.  4.) ROS related to problem: pt denies blurred vision but states he does have some "floaters"  5.) Assessment and plan per MD: Please see documentation

## 2014-04-13 ENCOUNTER — Other Ambulatory Visit: Payer: Self-pay

## 2014-04-13 MED ORDER — POTASSIUM CHLORIDE CRYS ER 10 MEQ PO TBCR: 10.0000 meq | EXTENDED_RELEASE_TABLET | Freq: Every day | ORAL | Status: DC

## 2014-04-13 MED ORDER — HYDROCHLOROTHIAZIDE 25 MG PO TABS: 25.0000 mg | ORAL_TABLET | Freq: Every day | ORAL | Status: DC

## 2014-04-16 LAB — FLECAINIDE LEVEL: FLECAINIDE: 0.53 ug/mL (ref 0.20–1.00)

## 2014-04-18 ENCOUNTER — Other Ambulatory Visit: Payer: Self-pay | Admitting: *Deleted

## 2014-04-18 MED ORDER — APIXABAN 5 MG PO TABS: ORAL_TABLET | ORAL | Status: DC

## 2014-06-10 ENCOUNTER — Telehealth: Payer: Self-pay | Admitting: Physician Assistant

## 2014-06-10 NOTE — Telephone Encounter (Signed)
Damon Krueger contacted the cardiology service for advise as his BP was been elevated for the past 2 days in the 180s/100s. Review of his PMH shows he has HTN and PAF on fleicainide.   He states he has been compliant with his BP medication include HCTZ, enalapril and Toprol XL. I have advised him to take an additional dose of toprol XL at night time if his SBP >160. I also advised him to contact cardiology office to arrange followup with Damon Krueger  Signed, Almyra Deforest PA Pager: [phone]

## 2014-06-21 ENCOUNTER — Telehealth: Payer: Self-pay | Admitting: Cardiovascular Disease

## 2014-06-21 MED ORDER — AMLODIPINE BESYLATE 5 MG PO TABS: 5.0000 mg | ORAL_TABLET | Freq: Every day | ORAL | Status: DC

## 2014-06-21 NOTE — Telephone Encounter (Signed)
Pt states this past week his BP has been elevated: 06/21/14 166/102--after medications  06/20/14 144/92 06/18/14 133/90 06/17/14 134/85 06/15/14 127/82   Heart rate average--70's

## 2014-06-21 NOTE — Telephone Encounter (Signed)
Reviewed with Versie Starks   Per Scott--add amlodipine 5mg  daily, continue to take and record BP, arrange follow up. Pt advised ,verbalized understanding, appt scheduled with Lucillie Garfinkel  07/22/14 (pt requests Friday).

## 2014-06-21 NOTE — Telephone Encounter (Signed)
Pt c/o BP issue: STAT if pt c/o blurred vision, one-sided weakness or slurred speech  1. What are your last 5 BP readings? 155/100 pt just took   2. Are you having any other symptoms (ex. Dizziness, headache, blurred vision, passed out)? Headache  3. What is your BP issue? bp is high

## 2014-06-27 ENCOUNTER — Telehealth: Payer: Self-pay | Admitting: Physician Assistant

## 2014-06-27 NOTE — Telephone Encounter (Signed)
Contacted the cardiology service to tell us his HR was fast this morning, however now it is more regular with HR 80s. He has a h/o of PAF on flecainide and Toprol XL. He has been compliant with medication.   I have instructed patient to seek medical attention if his HR persistently >100 at rest. It is possible he went into a-fib with RVR, however HR has since under control. I have reassured the patient. He is on eliquis for stroke prevention.   Hilbert Corrigan PA Pager: [phone]

## 2014-07-22 ENCOUNTER — Ambulatory Visit (INDEPENDENT_AMBULATORY_CARE_PROVIDER_SITE_OTHER): Payer: BC Managed Care – PPO | Admitting: Nurse Practitioner

## 2014-07-22 ENCOUNTER — Encounter: Payer: Self-pay | Admitting: Nurse Practitioner

## 2014-07-22 VITALS — BP 146/92 | HR 75 | Ht 71.0 in | Wt 210.8 lb

## 2014-07-22 DIAGNOSIS — I480 Paroxysmal atrial fibrillation: Secondary | ICD-10-CM

## 2014-07-22 DIAGNOSIS — I10 Essential (primary) hypertension: Secondary | ICD-10-CM

## 2014-07-22 DIAGNOSIS — I4819 Other persistent atrial fibrillation: Secondary | ICD-10-CM | POA: Insufficient documentation

## 2014-07-22 NOTE — Patient Instructions (Signed)
Medication Instructions:  Your physician recommends that you continue on your current medications as directed. Please refer to the Current Medication list given to you today.  Labwork: NONE  Testing/Procedures: NONE   Follow-Up: Your physician wants you to follow-up in: 6 months with Dr. Acie Fredrickson. You will receive a reminder letter in the mail two months in advance. If you don't receive a letter, please call our office to schedule the follow-up appointment.  Any Other Special Instructions Will Be Listed Below (If Applicable).

## 2014-07-22 NOTE — Progress Notes (Signed)
Patient Name: Damon Krueger Date of Encounter: 07/22/2014  Primary Care Provider:  Gennette Pac, MD Primary Cardiologist:  Joaquim Nam, MD   Chief Complaint  64 y/o male with a h/o HTN and PAF who presents for f/u today related to elevated blood pressures.  Past Medical History   Past Medical History  Diagnosis Date  . Diabetes mellitus   . Essential hypertension   . Prostate cancer   . PAF (paroxysmal atrial fibrillation)     a. Dx 02/2013. Placed on Cardizem, Eliquis (CHA2DS2VASc = 2);  b. 02/2013 Echo: EF 50-55%, mildly dil LA, mild LVH.  [name] Abnormal urinalysis     a. Tr Hgb by UA 02/2013 - instructed to f/u PCP.  [name] Diverticulosis   . Tubular adenoma of colon   . Internal hemorrhoid    Past Surgical History  Procedure Laterality Date  . Prostate surgery  2011  . Cardioversion N/A 03/18/2013    Procedure: CARDIOVERSION;  Surgeon: Candee Furbish, MD;  [facility];  Service: Cardiovascular;  Laterality: N/A;  pt shocked x4, starting with 120 joules, 150, 200 and 299 joules without successful conversion...Dr. Marlou Porch will have pt follow up with cardiologists...    Allergies  No Known Allergies  HPI  64 year old male with prior history of paroxysmal atrial fibrillation, diabetes, and hypertension. He is recently noted elevated blood pressures at home and called into the office. He was placed on Norvasc 5 mg daily. Since then, blood pressures have been running 120s to 130s at home. He occasionally expresses palpitations, maybe about once a month, lasting about 2 minutes, and resolving spontaneously. He is on chronic diltiazem and flecainide therapy and is also anticoagulated with eliquis. He has not been having any chest pain, PND, orthopnea, dizziness, syncope, edema, or early satiety. He does try to exercise some but is limited by knee pain.  Home Medications  Prior to Admission medications   Medication Sig Start Date End Date Taking? Authorizing Provider    ACCU-CHEK AVIVA PLUS test strip  10/09/13  Yes Historical Provider, MD  allopurinol (ZYLOPRIM) 300 MG tablet Take 300 mg by mouth daily.  08/01/13  Yes Historical Provider, MD  amLODipine (NORVASC) 5 MG tablet Take 1 tablet (5 mg total) by mouth daily. 06/21/14  Yes Scott Joylene Draft, PA-C  apixaban (ELIQUIS) 5 MG TABS tablet TAKE 1 TABLET BY MOUTH TWICE A DAY 04/18/14  Yes Thayer Headings, MD  atorvastatin (LIPITOR) 40 MG tablet Take 40 mg by mouth daily.  02/15/13  Yes Historical Provider, MD  COLCRYS 0.6 MG tablet Take 0.6 mg by mouth daily as needed (gout flare).  04/06/13  Yes Historical Provider, MD  enalapril (VASOTEC) 20 MG tablet Take 20 mg by mouth 2 (two) times daily.   Yes Historical Provider, MD  flecainide (TAMBOCOR) 50 MG tablet Take 50 mg by mouth 2 (two) times daily.   Yes Historical Provider, MD  hydrochlorothiazide (HYDRODIURIL) 25 MG tablet Take 1 tablet (25 mg total) by mouth daily. 04/13/14  Yes Thayer Headings, MD  metFORMIN (GLUCOPHAGE) 1000 MG tablet Take 1,000 mg by mouth 2 (two) times daily. 04/25/14  Yes Historical Provider, MD  metoprolol succinate (TOPROL-XL) 50 MG 24 hr tablet Take 1 tablet (50 mg total) by mouth daily. 04/08/14  Yes Thayer Headings, MD  [facility] DELICA LANCETS [address]  05/20/13  Yes Historical Provider, MD  potassium chloride (K-DUR,KLOR-CON) 10 MEQ tablet Take 1 tablet (10 mEq total) by mouth daily. 04/13/14  Yes  Thayer Headings, MD  tamsulosin (FLOMAX) 0.4 MG CAPS capsule Take 0.4 mg by mouth daily as needed (for urinary frequency).   Yes Historical Provider, MD    Review of Systems   Overall doing well as above. He does experience some anxiety related to his history of A. Fib.  All other systems reviewed and are otherwise negative except as noted above.  Physical Exam  VS:  BP 146/92 mmHg  Pulse 75  Ht 5\' 11"  (1.803 m)  Wt 210 lb 12.8 oz (95.618 kg)  BMI 29.41 kg/m2  SpO2 92% , BMI Body mass index is 29.41 kg/(m^2). GEN: Well nourished, well developed,  in no acute distress. HEENT: normal. Neck: Supple, no JVD, carotid bruits, or masses. Cardiac: RRR, no murmurs, rubs, or gallops. No clubbing, cyanosis, edema.  Radials/DP/PT 2+ and equal bilaterally.  Respiratory:  Respirations regular and unlabored, clear to auscultation bilaterally. GI: Soft, nontender, nondistended, BS + x 4. MS: no deformity or atrophy. Skin: warm and dry, no rash. Neuro:  Strength and sensation are intact. Psych: Normal affect.  Accessory Clinical Findings  ECG - regular sinus rhythm, 75, right axis, incomplete right bundle branch block.  Assessment & Plan  1.  Paroxysmal A. fibrillation: Overall doing well. He does experience infrequent palpitations maybe about once a month lasting 1 or 2 minutes and resolving spontaneously. He has not had any prolonged palpitations or evidence of recurrent atrial fibrillation. His CHA2DS2VASc is too and he is anticoagulate with eliquis. Continue flecainide and Toprol.  2. Essential hypertension: Blood pressures have been running high last month and he was placed on amlodipine 5 mg daily. Though his blood pressure is elevated today at 146/92, he has been running in the 120s to 130s at home. I advised that he should continue to follow his blood pressures at home and if he's beginning to notice more 140s, he can call and we can increase his amlodipine dose to 10 mg daily.  3. Type 2 diabetes mellitus: Followed by primary care.  4. Disposition: Follow-up with Dr. Acie Fredrickson in 6 months or sooner if necessary. He will contact us if blood pressures are elevated at home.  Murray Hodgkins, NP 07/22/2014, 9:42 AM

## 2014-08-26 ENCOUNTER — Other Ambulatory Visit: Payer: Self-pay | Admitting: Cardiovascular Disease

## 2014-10-27 ENCOUNTER — Emergency Department (HOSPITAL_COMMUNITY)
Admission: EM | Admit: 2014-10-27 | Discharge: 2014-10-27 | Disposition: A | Discharge status: Home / Self Care | Payer: BC Managed Care – PPO | Source: Home / Self Care | Attending: Family Medicine | Admitting: Family Medicine

## 2014-10-27 ENCOUNTER — Encounter (HOSPITAL_COMMUNITY): Payer: Self-pay | Admitting: Emergency Medicine

## 2014-10-27 DIAGNOSIS — R220 Localized swelling, mass and lump, head: Secondary | ICD-10-CM

## 2014-10-27 MED ORDER — METHOCARBAMOL 500 MG PO TABS: 500.0000 mg | ORAL_TABLET | Freq: Four times a day (QID) | ORAL | Status: DC | PRN

## 2014-10-27 MED ORDER — ONDANSETRON HCL 4 MG PO TABS: 4.0000 mg | ORAL_TABLET | Freq: Three times a day (TID) | ORAL | Status: DC | PRN

## 2014-10-27 MED ORDER — NEOMYCIN-POLYMYXIN-HC 3.5-10000-1 OT SOLN: OTIC | Status: DC

## 2014-10-27 MED ORDER — CLINDAMYCIN HCL 300 MG PO CAPS: 300.0000 mg | ORAL_CAPSULE | Freq: Three times a day (TID) | ORAL | Status: DC

## 2014-10-27 MED ORDER — DEXAMETHASONE 4 MG PO TABS: 10.0000 mg | ORAL_TABLET | Freq: Once | ORAL | Status: DC

## 2014-10-27 NOTE — ED Provider Notes (Signed)
CSN: [id number]     Arrival date & time 10/27/14  1500 History   First MD Initiated Contact with Patient 10/27/14 1516     Chief Complaint  Patient presents with  . Facial Swelling   (Consider location/radiation/quality/duration/timing/severity/associated sxs/prior Treatment) HPI  Right lower cheek swelling. Ongoing for 1 day. Constant. Getting worse. Mild tenderness to palpation. Denies any recent dental changes/cavities. No change medications. No purulent or bloody oral discharge. Denies fevers, nausea, vomiting, neck stiffness, headache, chest pain, palpitations. Denies recent medication changes.  Past Medical History  Diagnosis Date  . Diabetes mellitus   . Essential hypertension   . Prostate cancer   . PAF (paroxysmal atrial fibrillation)     a. Dx 02/2013. Placed on Cardizem, Eliquis (CHA2DS2VASc = 2);  b. 02/2013 Echo: EF 50-55%, mildly dil LA, mild LVH.  [name] Abnormal urinalysis     a. Tr Hgb by UA 02/2013 - instructed to f/u PCP.  [name] Diverticulosis   . Tubular adenoma of colon   . Internal hemorrhoid    Past Surgical History  Procedure Laterality Date  . Prostate surgery  2011  . Cardioversion N/A 03/18/2013    Procedure: CARDIOVERSION;  Surgeon: Candee Furbish, MD;  Location: [facility] ENDOSCOPY;  Service: Cardiovascular;  Laterality: N/A;  pt shocked x4, starting with 120 joules, 150, 200 and 299 joules without successful conversion...Dr. Marlou Porch will have pt follow up with cardiologists...   Family History  Problem Relation Age of Onset  . Hypertension    . Colon cancer Neg Hx   . Esophageal cancer Neg Hx   . Rectal cancer Neg Hx   . Stomach cancer Neg Hx   . Diabetes Sister   . Heart attack Brother    Social History  Substance Use Topics  . Smoking status: Never Smoker   . Smokeless tobacco: Never Used  . Alcohol Use: 0.0 oz/week    0 Standard drinks or equivalent per week     Comment: occasionally    Review of Systems Per HPI with all other pertinent systems negative.    Allergies  Review of patient's allergies indicates no known allergies.  Home Medications   Prior to Admission medications   Medication Sig Start Date End Date Taking? Authorizing Provider  ACCU-CHEK AVIVA PLUS test strip  10/09/13   Historical Provider, MD  allopurinol (ZYLOPRIM) 300 MG tablet Take 300 mg by mouth daily.  08/01/13   Historical Provider, MD  amLODipine (NORVASC) 5 MG tablet Take 1 tablet (5 mg total) by mouth daily. 06/21/14   Liliane Shi, PA-C  apixaban (ELIQUIS) 5 MG TABS tablet TAKE 1 TABLET BY MOUTH TWICE A DAY 04/18/14   Thayer Headings, MD  atorvastatin (LIPITOR) 40 MG tablet Take 40 mg by mouth daily.  02/15/13   Historical Provider, MD  clindamycin (CLEOCIN) 300 MG capsule Take 1 capsule (300 mg total) by mouth 3 (three) times daily. 10/27/14   Waldemar Dickens, MD  COLCRYS 0.6 MG tablet Take 0.6 mg by mouth daily as needed (gout flare).  04/06/13   Historical Provider, MD  enalapril (VASOTEC) 20 MG tablet Take 20 mg by mouth 2 (two) times daily.    Historical Provider, MD  flecainide (TAMBOCOR) 50 MG tablet Take 50 mg by mouth 2 (two) times daily.    Historical Provider, MD  hydrochlorothiazide (HYDRODIURIL) 25 MG tablet Take 1 tablet (25 mg total) by mouth daily. 04/13/14   Thayer Headings, MD  hydrochlorothiazide (HYDRODIURIL) 25 MG tablet TAKE 1 TABLET  DAILY 08/26/14   Thayer Headings, MD  metFORMIN (GLUCOPHAGE) 1000 MG tablet Take 1,000 mg by mouth 2 (two) times daily. 04/25/14   Historical Provider, MD  metoprolol succinate (TOPROL-XL) 50 MG 24 hr tablet Take 1 tablet (50 mg total) by mouth daily. 04/08/14   Thayer Headings, MD  neomycin-polymyxin-hydrocortisone (CORTISPORIN) otic solution 3-4 Drops, 4 times per day for 7 days 10/27/14   Waldemar Dickens, MD  [facility] LANCETS [address]  05/20/13   Historical Provider, MD  potassium chloride (K-DUR) 10 MEQ tablet TAKE 1 TABLET DAILY 08/26/14   Thayer Headings, MD  potassium chloride (K-DUR,KLOR-CON) 10 MEQ tablet Take 1  tablet (10 mEq total) by mouth daily. 04/13/14   Thayer Headings, MD  tamsulosin (FLOMAX) 0.4 MG CAPS capsule Take 0.4 mg by mouth daily as needed (for urinary frequency).    Historical Provider, MD   Meds Ordered and Administered this Visit  Medications - No data to display  BP 130/93 mmHg  Pulse 68  Temp(Src) 97.9 F (36.6 C) (Oral)  Resp 16  SpO2 98% No data found.   Physical Exam Physical Exam  Constitutional: oriented to person, place, and time. appears well-developed and well-nourished. No distress.  HENT:  Head: Normocephalic and atraumatic.  Right cheek swelling without periodontal swelling or tenderness. No fluctuance felt. Parotid, submandibular, sublingual glands normal. Eyes: EOMI. PERRL.  Neck: Normal range of motion.  Cardiovascular: RRR, no m/r/g, 2+ distal pulses,  Pulmonary/Chest: Effort normal and breath sounds normal. No respiratory distress.  Abdominal: Soft. Bowel sounds are normal. NonTTP, no distension.  Musculoskeletal: Normal range of motion. Non ttp, no effusion.  Neurological: alert and oriented to person, place, and time.  Skin: Skin is warm. No rash noted. non diaphoretic.  Psychiatric: normal mood and affect. behavior is normal. Judgment and thought content normal.   ED Course  Procedures (including critical care time)  Labs Review Labs Reviewed - No data to display  Imaging Review No results found.   Visual Acuity Review  Right Eye Distance:   Left Eye Distance:   Bilateral Distance:    Right Eye Near:   Left Eye Near:    Bilateral Near:         MDM   1. Facial swelling    Likely secondary to mild oral soft tissue infection. No evidence of abscess. No distress inflammation or swelling of parotid or sub-and tubular glands though I guess this is technically still a possibility. Start antibiotics/clindamycin and Tylenol for pain.    Waldemar Dickens, MD 10/27/14 [phone]

## 2014-10-27 NOTE — Discharge Instructions (Signed)
You likely have developed an infection in your cheek that will require antibiotics in order to clear. Please start the antibiotic as prescribed. Please use Tylenol 1000 mg every 8 hours for pain. Please also start eating hard and sour candies in order to promote salivation. This will help clear the infection faster. If your symptoms get worse please go to the emergency room.

## 2014-10-27 NOTE — ED Notes (Signed)
C/o right side facial swelling Denies any injury to face States teeth are great

## 2014-11-11 ENCOUNTER — Other Ambulatory Visit: Payer: Self-pay

## 2014-11-11 MED ORDER — APIXABAN 5 MG PO TABS: ORAL_TABLET | ORAL | Status: DC

## 2014-11-11 NOTE — Telephone Encounter (Signed)
Rogelia Mire, NP at 07/22/2014 9:42 AM  apixaban (ELIQUIS) 5 MG TABS tabletTAKE 1 TABLET BY MOUTH TWICE A DAY Assessment & Plan 1. Paroxysmal A. fibrillation: Overall doing well. He does experience infrequent palpitations maybe about once a month lasting 1 or 2 minutes and resolving spontaneously. He has not had any prolonged palpitations or evidence of recurrent atrial fibrillation. His CHA2DS2VASc is too and he is anticoagulate with eliquis. Continue flecainide and Toprol.

## 2014-11-15 ENCOUNTER — Other Ambulatory Visit: Payer: Self-pay

## 2014-11-15 MED ORDER — APIXABAN 5 MG PO TABS: ORAL_TABLET | ORAL | Status: DC

## 2014-11-15 NOTE — Telephone Encounter (Signed)
Pt called stating that Eliquis 5 mg was sent to [facility] and he wanted it to go to [facility].. I called to let him know that I did change that for him and he told me that [facility] already had his pills on the way. I called [facility] and told them to cancel the Rx. Pt asked me to make a note that any time he get Eliquis it needs to go to [facility] and everything else that he gets will go to [facility].

## 2014-12-16 ENCOUNTER — Ambulatory Visit
Admission: RE | Admit: 2014-12-16 | Discharge: 2014-12-16 | Disposition: A | Discharge status: Home / Self Care | Payer: BC Managed Care – PPO | Source: Ambulatory Visit | Attending: Family Medicine | Admitting: Family Medicine

## 2014-12-16 ENCOUNTER — Other Ambulatory Visit: Payer: Self-pay | Admitting: Family Medicine

## 2014-12-16 DIAGNOSIS — R202 Paresthesia of skin: Secondary | ICD-10-CM

## 2015-01-11 ENCOUNTER — Other Ambulatory Visit: Payer: Self-pay | Admitting: *Deleted

## 2015-01-11 MED ORDER — APIXABAN 5 MG PO TABS: ORAL_TABLET | ORAL | Status: DC

## 2015-02-05 ENCOUNTER — Other Ambulatory Visit: Payer: Self-pay | Admitting: Cardiovascular Disease

## 2015-02-09 ENCOUNTER — Other Ambulatory Visit: Payer: Self-pay | Admitting: Cardiovascular Disease

## 2015-02-09 MED ORDER — APIXABAN 5 MG PO TABS: ORAL_TABLET | ORAL | Status: DC

## 2015-02-14 ENCOUNTER — Ambulatory Visit (INDEPENDENT_AMBULATORY_CARE_PROVIDER_SITE_OTHER): Payer: BC Managed Care – PPO | Admitting: Cardiovascular Disease

## 2015-02-14 ENCOUNTER — Ambulatory Visit: Payer: BC Managed Care – PPO | Admitting: Cardiovascular Disease

## 2015-02-14 ENCOUNTER — Encounter: Payer: Self-pay | Admitting: Cardiovascular Disease

## 2015-02-14 VITALS — BP 120/90 | HR 78 | Ht 71.0 in | Wt 203.0 lb

## 2015-02-14 DIAGNOSIS — I119 Hypertensive heart disease without heart failure: Secondary | ICD-10-CM | POA: Diagnosis not present

## 2015-02-14 DIAGNOSIS — I10 Essential (primary) hypertension: Secondary | ICD-10-CM

## 2015-02-14 DIAGNOSIS — I4891 Unspecified atrial fibrillation: Secondary | ICD-10-CM | POA: Diagnosis not present

## 2015-02-14 DIAGNOSIS — I480 Paroxysmal atrial fibrillation: Secondary | ICD-10-CM | POA: Diagnosis not present

## 2015-02-14 LAB — COMPREHENSIVE METABOLIC PANEL
ALBUMIN: 4.2 g/dL (ref 3.6–5.1)
ALK PHOS: 55 U/L (ref 40–115)
ALT: 23 U/L (ref 9–46)
AST: 23 U/L (ref 10–35)
BUN: 24 mg/dL (ref 7–25)
CHLORIDE: 102 mmol/L (ref 98–110)
CO2: 28 mmol/L (ref 20–31)
Calcium: 10.3 mg/dL (ref 8.6–10.3)
Creat: 1.39 mg/dL — ABNORMAL HIGH (ref 0.70–1.25)
Glucose, Bld: 72 mg/dL (ref 65–99)
POTASSIUM: 4 mmol/L (ref 3.5–5.3)
Sodium: 142 mmol/L (ref 135–146)
TOTAL PROTEIN: 7.4 g/dL (ref 6.1–8.1)
Total Bilirubin: 0.6 mg/dL (ref 0.2–1.2)

## 2015-02-14 MED ORDER — CARVEDILOL 12.5 MG PO TABS: 12.5000 mg | ORAL_TABLET | Freq: Two times a day (BID) | ORAL | Status: DC

## 2015-02-14 NOTE — Progress Notes (Signed)
.   Cardiology Office Note   Date:  02/14/2015   ID:  Damon Krueger, DOB [birth year], MRN [id number]  PCP:  Damon Pac, MD  Cardiologist:   Damon Headings, MD   Chief Complaint  Patient presents with  . Follow-up    pt states his blood pressure has been high since being taken off of Enalapril due to allergic reaction  . Atrial Fibrillation  . Hypertension   Problem list: 1. Essential hypertension 2. Diabetes mellitus 3. Prostate cancer 4. Paroxysmal Atrial fibrillation  Damon Krueger is a 65 y.o. 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 He was recently admitted 1/7-1/8 with AFib with RVR in the setting of URI (Flu negative). He was placed on CCB and Eliquis (CHADS2-VASc=2). CEs were negative. Echo (02/11/2013): Mild LVH, EF 50-55%, mild LAE. Plan was to proceed with DCCV if he did not convert to NSR on his own (? If viral illness resulted in AFib).   Feb. 13, 2015: He underwent cardioversion but unfortunately, he did not convert to sinus rhythm. His Cardizem was increased to 360 mg a day. He feels well. He's completely asymptomatic. He cannot tell that his heart rate is beating irregularly or not.  April 29, 2013:  The patient has failed cardioversion back in February ( as noted above) . We started him on flecainide and he converted to normal sinus rhythm. A stress Myoview study was negative for ischemia. He did not have any evidence of pro-arrhythmia. He's feeling quite well. He's walking about 20 miles a week.  July 14, 2013:  Damon Krueger is doing well. Still walking 20 miles a week. He's not had any palpitations. He denies any chest pain or shortness of breath  Sept. 24, 2015:  Damon Krueger is doing . No arrhythmias. No CP. No dyspnea. Exercising regularly He measures his blood pressure at home on a regular basis. His typical readings were in the 110/73 range    April 08, 2014:   Damon Krueger is a 65 y.o. male who  presents for follow-up of his atrial fibrillation and hypertension. BP is typically much better at home.  BP is a bit high tonight .   He complains of having "floaters" in his visual field.    [name]/ 10, 2017"  He developed an allergic reaction to the Enalapril.   It was stopped.  BP runs high on occasion Is back exercising - walks / jogs 25 miles a week .    Past Medical History  Diagnosis Date  . Diabetes mellitus   . Essential hypertension   . Prostate cancer ([place])   . PAF (paroxysmal atrial fibrillation) ([place])     a. Dx 02/2013. Placed on Cardizem, Eliquis (CHA2DS2VASc = 2);  b. 02/2013 Echo: EF 50-55%, mildly dil LA, mild LVH.  Damon Krueger Abnormal urinalysis     a. Tr Hgb by UA 02/2013 - instructed to f/u PCP.  Damon Krueger Diverticulosis   . Tubular adenoma of colon   . Internal hemorrhoid     Past Surgical History  Procedure Laterality Date  . Prostate surgery  2011  . Cardioversion N/A 03/18/2013    Procedure: CARDIOVERSION;  Surgeon: Damon Furbish, MD;  Location: [facility] ENDOSCOPY;  Service: Cardiovascular;  Laterality: N/A;  pt shocked x4, starting with 120 joules, 150, 200 and 299 joules without successful conversion...Dr. Marlou Krueger will have pt follow up with cardiologists...     Current Outpatient Prescriptions  Medication Sig Dispense Refill  .  ACCU-CHEK AVIVA PLUS test strip     . allopurinol (ZYLOPRIM) 300 MG tablet Take 300 mg by mouth daily.     Damon Krueger amLODipine (NORVASC) 5 MG tablet Take 1 tablet (5 mg total) by mouth daily. 90 tablet 3  . apixaban (ELIQUIS) 5 MG TABS tablet TAKE 1 TABLET BY MOUTH TWICE A DAY 60 tablet 0  . atorvastatin (LIPITOR) 40 MG tablet Take 40 mg by mouth daily.     Damon Krueger COLCRYS 0.6 MG tablet Take 0.6 mg by mouth daily as needed (gout flare).     . flecainide (TAMBOCOR) 50 MG tablet Take 50 mg by mouth 2 (two) times daily.    Damon Krueger glimepiride (AMARYL) 1 MG tablet Take 1 mg by mouth daily.  0  . hydrochlorothiazide (HYDRODIURIL) 25 MG tablet Take 1 tablet (25 mg total) by mouth  daily. 90 tablet 1  . metFORMIN (GLUCOPHAGE) 1000 MG tablet Take 1,000 mg by mouth 2 (two) times daily.    . metoprolol succinate (TOPROL-XL) 50 MG 24 hr tablet Take 1 tablet (50 mg total) by mouth daily. 90 tablet 3  . ONETOUCH DELICA LANCETS [id number] MISC     . potassium chloride (KLOR-CON M10) 10 MEQ tablet Take 1 tablet (10 mEq total) by mouth daily. 90 tablet 0  . tamsulosin (FLOMAX) 0.4 MG CAPS capsule Take 0.4 mg by mouth daily as needed (for urinary frequency).     No current facility-administered medications for this visit.    Allergies:   Enalapril    Social History:  The patient  reports that he has never smoked. He has never used smokeless tobacco. He reports that he drinks alcohol. He reports that he does not use illicit drugs.   Family History:  The patient's family history includes Diabetes in his sister; Heart attack in his brother. There is no history of Colon cancer, Esophageal cancer, Rectal cancer, or Stomach cancer.    ROS:  Please see the history of present illness.    Review of Systems: Constitutional:  denies fever, chills, diaphoresis, appetite change and fatigue.  HEENT: denies photophobia, eye pain, redness, hearing loss, ear pain, congestion, sore throat, rhinorrhea, sneezing, neck pain, neck stiffness and tinnitus.  Respiratory: denies SOB, DOE, cough, chest tightness, and wheezing.  Cardiovascular: denies chest pain, palpitations and leg swelling.  Gastrointestinal: denies nausea, vomiting, abdominal pain, diarrhea, constipation, blood in stool.  Genitourinary: denies dysuria, urgency, frequency, hematuria, flank pain and difficulty urinating.  Musculoskeletal: denies  myalgias, back pain, joint swelling, arthralgias and gait problem.   Skin: denies pallor, rash and wound.  Neurological: denies dizziness, seizures, syncope, weakness, light-headedness, numbness and headaches.   Hematological: denies adenopathy, easy bruising, personal or family bleeding history.   Psychiatric/ Behavioral: denies suicidal ideation, mood changes, confusion, nervousness, sleep disturbance and agitation.       All other systems are reviewed and negative.    PHYSICAL EXAM: VS:  BP 120/90 mmHg  Pulse 78  Ht 5\' 11"  (1.803 m)  Wt 203 lb (92.08 kg)  BMI 28.33 kg/m2  SpO2 96% , BMI Body mass index is 28.33 kg/(m^2). GEN: Well nourished, well developed, in no acute distress HEENT: normal Neck: no JVD, carotid bruits, or masses Cardiac: RRR; no murmurs, rubs, or gallops,no edema  Respiratory:  clear to auscultation bilaterally, normal work of breathing GI: soft, nontender, nondistended, + BS MS: no deformity or atrophy Skin: warm and dry, no rash Neuro:  Strength and sensation are intact Psych: normal   EKG:  EKG  is ordered today. The ekg ordered today demonstrates normal sinus rhythm. QRS duration is 134 ms.   Recent Labs: No results found for requested labs within last 365 days.    Lipid Panel No results found for: CHOL, TRIG, HDL, CHOLHDL, VLDL, LDLCALC, LDLDIRECT    Wt Readings from Last 3 Encounters:  02/14/15 203 lb (92.08 kg)  07/22/14 210 lb 12.8 oz (95.618 kg)  04/11/14 212 lb 8 oz (96.389 kg)      Other studies Reviewed: Additional studies/ records that were reviewed today include: . Review of the above records demonstrates:    ASSESSMENT AND PLAN:  1.  Paroxysmal atrial fibrillation. Patric is doing fairly well. He does describe some visual disturbances on Flecainide 100 mg BID  but tolerates 50 mg BID.     2. Essential hypertension: Blood pressure is a little elevated today. Will DC metoprolol and try Coreg 12. 5 BID.    Current medicines are reviewed at length with the patient today.  The patient does not have concerns regarding medicines.  The following changes have been made:  Changed metoprolol to Coreg.   Disposition:   FU with me in 3 months.     Signed, Nahser, Wonda Cheng, MD  02/14/2015 2:18 PM    [facility] [place], [place], [place]  [address] Phone: [phone]; Fax: [phone]

## 2015-02-14 NOTE — Patient Instructions (Signed)
Medication Instructions:  STOP Metoprolol START Coreg 12.5 mg twice daily   Labwork: TODAY - basic metabolic panel, liver panel   Testing/Procedures: None Ordered   Follow-Up: Your physician wants you to follow-up in: 6 months with Dr. Acie Fredrickson.  You will receive a reminder letter in the mail two months in advance. If you don't receive a letter, please call our office to schedule the follow-up appointment.  If you need a refill on your cardiac medications before your next appointment, please call your pharmacy.   Thank you for choosing [facility]! Christen Bame, RN [phone]

## 2015-02-23 ENCOUNTER — Other Ambulatory Visit: Payer: Self-pay | Admitting: Cardiovascular Disease

## 2015-03-03 ENCOUNTER — Other Ambulatory Visit: Payer: Self-pay | Admitting: *Deleted

## 2015-03-03 MED ORDER — POTASSIUM CHLORIDE CRYS ER 10 MEQ PO TBCR: 10.0000 meq | EXTENDED_RELEASE_TABLET | Freq: Every day | ORAL | Status: DC

## 2015-03-03 MED ORDER — HYDROCHLOROTHIAZIDE 25 MG PO TABS: 25.0000 mg | ORAL_TABLET | Freq: Every day | ORAL | Status: DC

## 2015-03-13 ENCOUNTER — Encounter: Payer: Self-pay | Admitting: Gastroenterology

## 2015-03-17 ENCOUNTER — Other Ambulatory Visit: Payer: Self-pay | Admitting: *Deleted

## 2015-03-17 MED ORDER — APIXABAN 5 MG PO TABS: ORAL_TABLET | ORAL | Status: DC

## 2015-03-19 ENCOUNTER — Other Ambulatory Visit: Payer: Self-pay | Admitting: Cardiovascular Disease

## 2015-05-14 ENCOUNTER — Other Ambulatory Visit: Payer: Self-pay | Admitting: Physician Assistant

## 2015-05-22 ENCOUNTER — Other Ambulatory Visit: Payer: Self-pay

## 2015-05-22 MED ORDER — FLECAINIDE ACETATE 50 MG PO TABS: 50.0000 mg | ORAL_TABLET | Freq: Two times a day (BID) | ORAL | Status: DC

## 2015-07-27 IMAGING — CR DG CHEST 1V PORT
1 series · 1 of 1 positions shown · non-contrast
Comparison: Portable chest x-ray dated March 19, 2010.

CLINICAL DATA: New onset atrial fibrillation

EXAM:
PORTABLE CHEST - 1 VIEW

[AP]
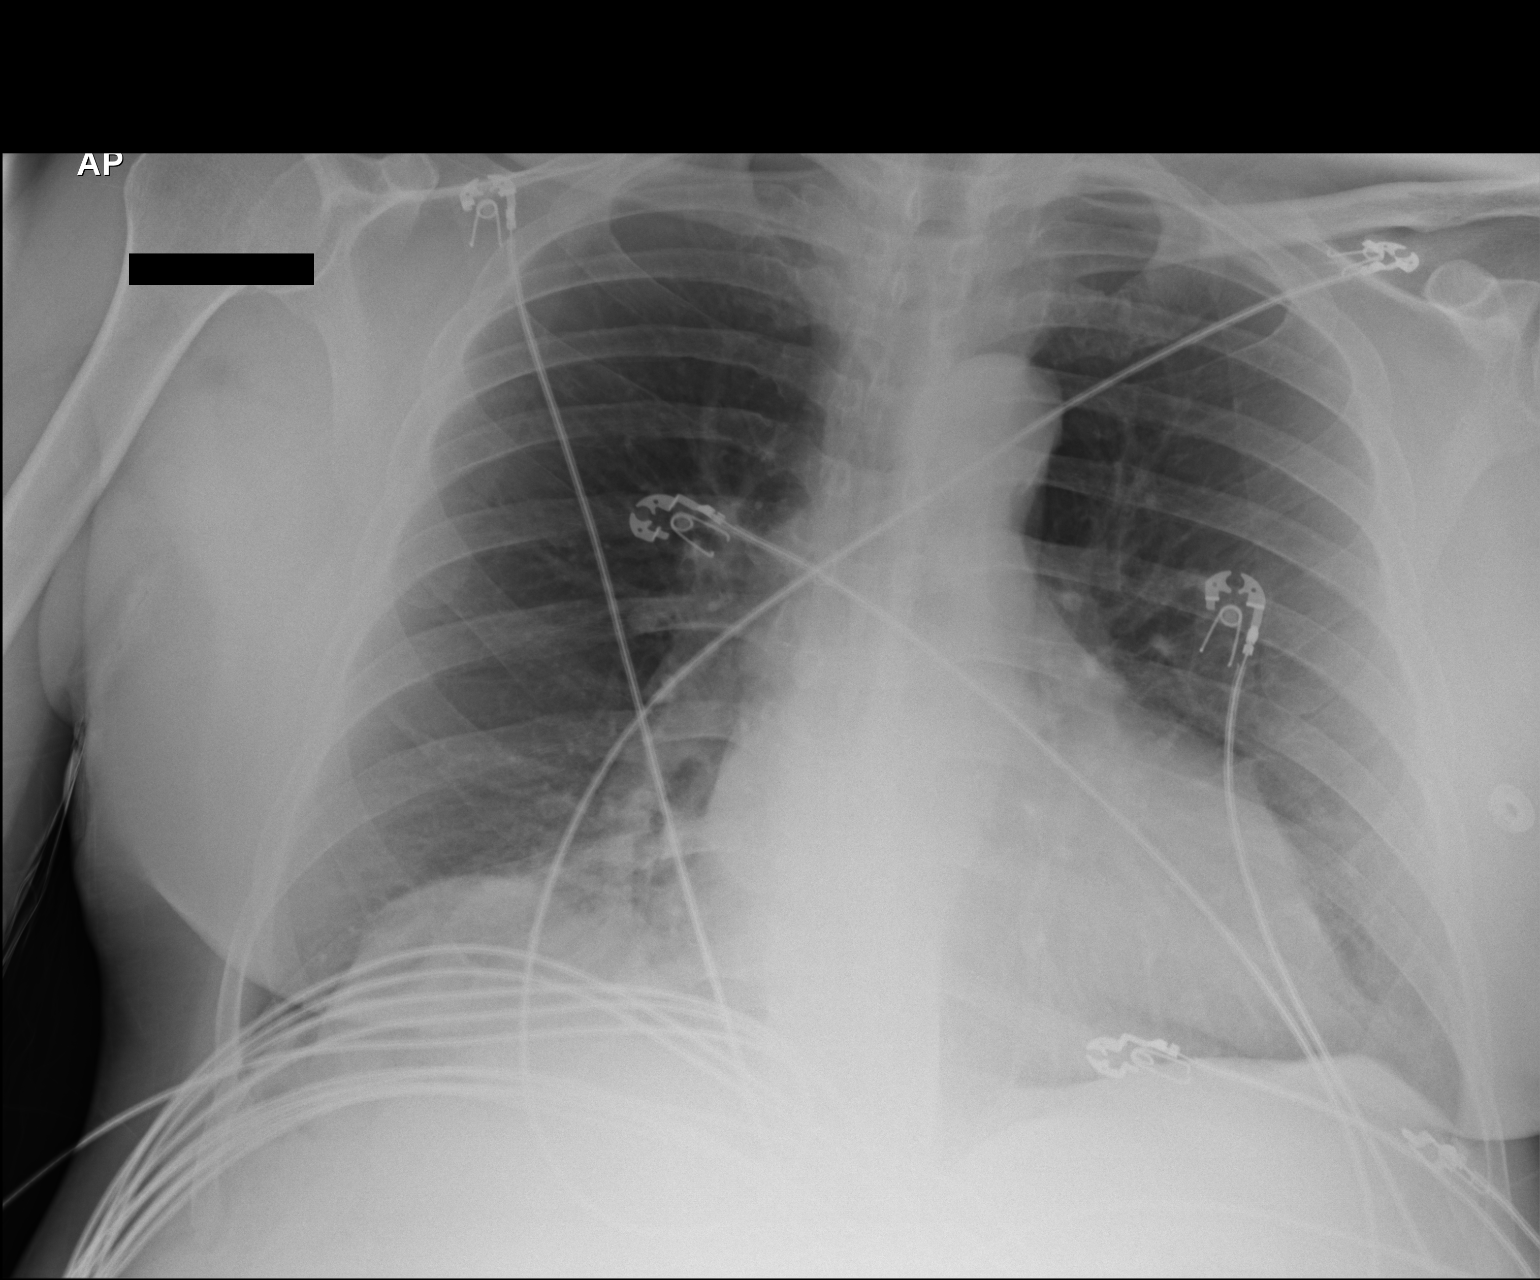

[1 of 1 positions shown; findings below may reference images not displayed]

FINDINGS: The lungs are well-expanded. There is no focal infiltrate. There are
coarse infrahilar lung markings on the right partially obscuring the
medial cardio phrenic angle. The cardiopericardial silhouette is
top-normal in size. The pulmonary vascularity is not engorged. The
mediastinum is normal in width. There is mild tortuosity of the
descending thoracic aorta. There is no pleural effusion or
pneumothorax. No pulmonary parenchymal mass is demonstrated.
IMPRESSION: 1. There is no evidence of CHF.
2. Increased lung markings in the right infrahilar region may
reflect subsegmental atelectasis. When the patient can tolerate the
procedure, a PA and lateral chest x-ray would be of value.
.

## 2015-08-01 ENCOUNTER — Encounter: Payer: Self-pay | Admitting: Cardiovascular Disease

## 2015-08-16 ENCOUNTER — Encounter: Payer: Self-pay | Admitting: Cardiovascular Disease

## 2015-08-16 ENCOUNTER — Ambulatory Visit (INDEPENDENT_AMBULATORY_CARE_PROVIDER_SITE_OTHER): Payer: BC Managed Care – PPO | Admitting: Cardiovascular Disease

## 2015-08-16 ENCOUNTER — Encounter (INDEPENDENT_AMBULATORY_CARE_PROVIDER_SITE_OTHER): Payer: Self-pay

## 2015-08-16 VITALS — BP 110/90 | HR 65 | Ht 71.0 in | Wt 204.8 lb

## 2015-08-16 DIAGNOSIS — I4891 Unspecified atrial fibrillation: Secondary | ICD-10-CM

## 2015-08-16 DIAGNOSIS — I10 Essential (primary) hypertension: Secondary | ICD-10-CM

## 2015-08-16 DIAGNOSIS — I480 Paroxysmal atrial fibrillation: Secondary | ICD-10-CM | POA: Diagnosis not present

## 2015-08-16 LAB — BASIC METABOLIC PANEL
BUN: 23 mg/dL (ref 7–25)
CHLORIDE: 104 mmol/L (ref 98–110)
CO2: 24 mmol/L (ref 20–31)
Calcium: 9.3 mg/dL (ref 8.6–10.3)
Creat: 1.13 mg/dL (ref 0.70–1.25)
GLUCOSE: 168 mg/dL — AB (ref 65–99)
POTASSIUM: 3.8 mmol/L (ref 3.5–5.3)
SODIUM: 140 mmol/L (ref 135–146)

## 2015-08-16 MED ORDER — CARVEDILOL 12.5 MG PO TABS: 12.5000 mg | ORAL_TABLET | Freq: Two times a day (BID) | ORAL | Status: DC

## 2015-08-16 NOTE — Patient Instructions (Signed)
Medication Instructions:  Your physician recommends that you continue on your current medications as directed. Please refer to the Current Medication list given to you today.   Labwork: Lab work to be done today--BMP  Testing/Procedures: none  Follow-Up: Your physician wants you to follow-up in: 6 months.  You will receive a reminder letter in the mail two months in advance. If you don't receive a letter, please call our office to schedule the follow-up appointment.   Any Other Special Instructions Will Be Listed Below (If Applicable).     If you need a refill on your cardiac medications before your next appointment, please call your pharmacy.   

## 2015-08-16 NOTE — Progress Notes (Signed)
.   Cardiology Office Note   Date:  08/16/2015   ID:  Damon Hector, PhD, DOB 19-Jul-1950, MRN [id number]  PCP:  Gennette Pac, MD  Cardiologist:   Mertie Moores, MD   Chief Complaint  Patient presents with  . Follow-up    atrial fib   Problem list: 1. Essential hypertension 2. Diabetes mellitus 3. Prostate cancer 4. Paroxysmal Atrial fibrillation  Damon Krueger is a 65 y.o. male [place] business professor at [employer] with a hx of HTN, DM, prostate CA. He was recently admitted 1/7-1/8 with AFib with RVR in the setting of URI (Flu negative). He was placed on CCB and Eliquis (CHADS2-VASc=2). CEs were negative. Echo (02/11/2013): Mild LVH, EF 50-55%, mild LAE. Plan was to proceed with DCCV if he did not convert to NSR on his own (? If viral illness resulted in AFib).   Feb. 13, 2015: He underwent cardioversion but unfortunately, he did not convert to sinus rhythm. His Cardizem was increased to 360 mg a day. He feels well. He's completely asymptomatic. He cannot tell that his heart rate is beating irregularly or not.  April 29, 2013:  The patient has failed cardioversion back in February ( as noted above) . We started him on flecainide and he converted to normal sinus rhythm. A stress Myoview study was negative for ischemia. He did not have any evidence of pro-arrhythmia. He's feeling quite well. He's walking about 20 miles a week.  July 14, 2013:  [place] is doing well. Still walking 20 miles a week. He's not had any palpitations. He denies any chest pain or shortness of breath  Sept. 24, 2015:  Dory is doing . No arrhythmias. No CP. No dyspnea. Exercising regularly He measures his blood pressure at home on a regular basis. His typical readings were in the 110/73 range    April 08, 2014:   Damon Hector, PhD is a 65 y.o. male who presents for follow-up of his atrial fibrillation and hypertension. BP is typically much better at home.  BP is a bit high tonight  .   He complains of having "floaters" in his visual field.    [name]/ 10, 2017"  He developed an allergic reaction to the Enalapril.   It was stopped.  BP runs high on occasion Is back exercising - walks / jogs 25 miles a week .    August 16, 2015:  Doing well .   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   Past Medical History  Diagnosis Date  . Diabetes mellitus   . Essential hypertension   . Prostate cancer ([place])   . PAF (paroxysmal atrial fibrillation) ([place])     a. Dx 02/2013. Placed on Cardizem, Eliquis (CHA2DS2VASc = 2);  b. 02/2013 Echo: EF 50-55%, mildly dil LA, mild LVH.  [name] Abnormal urinalysis     a. Tr Hgb by UA 02/2013 - instructed to f/u PCP.  [name] Diverticulosis   . Tubular adenoma of colon   . Internal hemorrhoid     Past Surgical History  Procedure Laterality Date  . Prostate surgery  2011  . Cardioversion N/A 03/18/2013    Procedure: CARDIOVERSION;  Surgeon: Candee Furbish, MD;  Location: [facility] ENDOSCOPY;  Service: Cardiovascular;  Laterality: N/A;  pt shocked x4, starting with 120 joules, 150, 200 and 299 joules without successful conversion...Dr. Marlou Porch will have pt follow up with cardiologists...     Current Outpatient Prescriptions  Medication Sig Dispense Refill  . ACCU-CHEK AVIVA PLUS test strip     . allopurinol (ZYLOPRIM) 300 MG tablet Take 300 mg by mouth daily.     [name] amLODipine (NORVASC) 5 MG tablet Take 1 tablet (5 mg total) by mouth daily. 90 tablet 2  . apixaban (ELIQUIS) 5 MG TABS tablet TAKE 1 TABLET BY MOUTH TWICE A DAY 60 tablet 11  . atorvastatin (LIPITOR) 40 MG tablet Take 40 mg by mouth daily.     . carvedilol (COREG) 12.5 MG tablet Take 1 tablet (12.5 mg total) by mouth 2 (two) times daily. 60 tablet 11  . COLCRYS 0.6 MG tablet Take 0.6 mg by mouth daily as needed (gout flare).     . flecainide (TAMBOCOR) 50 MG tablet Take 1 tablet (50 mg total) by mouth 2 (two) times daily. 180 tablet 2  . glimepiride  (AMARYL) 1 MG tablet Take 1 mg by mouth daily.  0  . hydrochlorothiazide (HYDRODIURIL) 25 MG tablet Take 1 tablet (25 mg total) by mouth daily. 90 tablet 3  . metFORMIN (GLUCOPHAGE) 1000 MG tablet Take 1,000 mg by mouth 2 (two) times daily.    [name] DELICA LANCETS [id number] MISC     . potassium chloride (KLOR-CON M10) 10 MEQ tablet Take 1 tablet (10 mEq total) by mouth daily. 90 tablet 3  . tamsulosin (FLOMAX) 0.4 MG CAPS capsule Take 0.4 mg by mouth daily as needed (for urinary frequency).     No current facility-administered medications for this visit.    Allergies:   Enalapril    Social History:  The patient  reports that he has never smoked. He has never used smokeless tobacco. He reports that he drinks alcohol. He reports that he does not use illicit drugs.   Family History:  The patient's family history includes Diabetes in his sister; Heart attack in his brother. There is no history of Colon cancer, Esophageal cancer, Rectal cancer, or Stomach cancer.    ROS:  Please see the history of present illness.    Review of Systems: Constitutional:  denies fever, chills, diaphoresis, appetite change and fatigue.  HEENT: denies photophobia, eye pain, redness, hearing loss, ear pain, congestion, sore throat, rhinorrhea, sneezing, neck pain, neck stiffness and tinnitus.  Respiratory: denies SOB, DOE, cough, chest tightness, and wheezing.  Cardiovascular: denies chest pain, palpitations and leg swelling.  Gastrointestinal: denies nausea, vomiting, abdominal pain, diarrhea, constipation, blood in stool.  Genitourinary: denies dysuria, urgency, frequency, hematuria, flank pain and difficulty urinating.  Musculoskeletal: denies  myalgias, back pain, joint swelling, arthralgias and gait problem.   Skin: denies pallor, rash and wound.  Neurological: denies dizziness, seizures, syncope, weakness, light-headedness, numbness and headaches.   Hematological: denies adenopathy, easy bruising, personal  or family bleeding history.  Psychiatric/ Behavioral: denies suicidal ideation, mood changes, confusion, nervousness, sleep disturbance and agitation.       All other systems are reviewed and negative.    PHYSICAL EXAM: VS:  BP 110/90 mmHg  Pulse 65  Ht 5\' 11"  (1.803 m)  Wt 204 lb 12.8 oz (92.897 kg)  BMI 28.58 kg/m2 , BMI Body mass index is 28.58 kg/(m^2). GEN: Well nourished, well developed, in no acute distress HEENT: normal Neck: no JVD, carotid bruits, or masses Cardiac: RRR; no murmurs, rubs, or gallops,no edema  Respiratory:  clear to auscultation bilaterally, normal work of breathing GI: soft, nontender, nondistended, + BS MS: no deformity or atrophy Skin: warm and dry, no rash Neuro:  Strength and sensation  are intact Psych: normal   EKG:  EKG is ordered today. The ekg ordered today demonstrates normal sinus rhythm at 65.   QRS duration is 116 ms.     Recent Labs: 02/14/2015: ALT 23; BUN 24; Creat 1.39*; Potassium 4.0; Sodium 142    Lipid Panel No results found for: CHOL, TRIG, HDL, CHOLHDL, VLDL, LDLCALC, LDLDIRECT    Wt Readings from Last 3 Encounters:  08/16/15 204 lb 12.8 oz (92.897 kg)  02/14/15 203 lb (92.08 kg)  07/22/14 210 lb 12.8 oz (95.618 kg)      Other studies Reviewed: Additional studies/ records that were reviewed today include: . Review of the above records demonstrates:    ASSESSMENT AND PLAN:  1.  Paroxysmal atrial fibrillation. Pate is doing fairly well. He does describe some visual disturbances on Flecainide 100 mg BID  but tolerates 50 mg BID.    He is maintaining normal sinus rhythm. Continue current dose of flecainide.  2. Essential hypertension: Blood pressure is a little elevated on occasion but his levels are good today. He'll keep a blood pressure log. He had a reaction to enalapril was mild lip swelling. He may tolerate losartan. We can consider adding losartan at his next visit. I'll see him again in 6 months for  follow-up visit..    Current medicines are reviewed at length with the patient today.  The patient does not have concerns regarding medicines.   Disposition:   FU with me in 6 months.     Signed, Mertie Moores, MD  08/16/2015 10:39 AM    [facility] [place], [place],   [address] Phone: [phone]; Fax: [phone]

## 2016-02-07 ENCOUNTER — Other Ambulatory Visit: Payer: Self-pay | Admitting: Cardiovascular Disease

## 2016-02-10 ENCOUNTER — Other Ambulatory Visit: Payer: Self-pay | Admitting: Cardiovascular Disease

## 2016-02-21 ENCOUNTER — Ambulatory Visit: Payer: BC Managed Care – PPO | Admitting: Cardiovascular Disease

## 2016-02-23 ENCOUNTER — Other Ambulatory Visit: Payer: Self-pay | Admitting: Cardiovascular Disease

## 2016-03-12 ENCOUNTER — Other Ambulatory Visit: Payer: Self-pay | Admitting: Cardiovascular Disease

## 2016-03-12 NOTE — Telephone Encounter (Signed)
Pt last saw Dr Acie Fredrickson 08/16/15, last labs 08/16/15 Creat 1.13, weight 92.2, Age 66, per specified criteria pt is on appropriate dosage of Eliquis 5mg  BID.  Will refill rx x 6 months.

## 2016-03-24 ENCOUNTER — Emergency Department (HOSPITAL_COMMUNITY)
Admission: EM | Admit: 2016-03-24 | Discharge: 2016-03-24 | Disposition: A | Discharge status: Home / Self Care | Payer: BC Managed Care – PPO | Attending: Emergency Medicine | Admitting: Emergency Medicine

## 2016-03-24 ENCOUNTER — Encounter (HOSPITAL_COMMUNITY): Payer: Self-pay

## 2016-03-24 ENCOUNTER — Emergency Department (HOSPITAL_COMMUNITY): Payer: BC Managed Care – PPO

## 2016-03-24 DIAGNOSIS — Z7901 Long term (current) use of anticoagulants: Secondary | ICD-10-CM | POA: Diagnosis not present

## 2016-03-24 DIAGNOSIS — E119 Type 2 diabetes mellitus without complications: Secondary | ICD-10-CM | POA: Insufficient documentation

## 2016-03-24 DIAGNOSIS — R0789 Other chest pain: Secondary | ICD-10-CM | POA: Insufficient documentation

## 2016-03-24 DIAGNOSIS — Z7984 Long term (current) use of oral hypoglycemic drugs: Secondary | ICD-10-CM | POA: Insufficient documentation

## 2016-03-24 DIAGNOSIS — Z8546 Personal history of malignant neoplasm of prostate: Secondary | ICD-10-CM | POA: Insufficient documentation

## 2016-03-24 DIAGNOSIS — I10 Essential (primary) hypertension: Secondary | ICD-10-CM | POA: Insufficient documentation

## 2016-03-24 DIAGNOSIS — R079 Chest pain, unspecified: Secondary | ICD-10-CM

## 2016-03-24 LAB — CBC
HCT: 45.6 % (ref 39.0–52.0)
Hemoglobin: 15.8 g/dL (ref 13.0–17.0)
MCH: 33.3 pg (ref 26.0–34.0)
MCHC: 34.6 g/dL (ref 30.0–36.0)
MCV: 96 fL (ref 78.0–100.0)
Platelets: 151 10*3/uL (ref 150–400)
RBC: 4.75 MIL/uL (ref 4.22–5.81)
RDW: 14.1 % (ref 11.5–15.5)
WBC: 7.2 10*3/uL (ref 4.0–10.5)

## 2016-03-24 LAB — BASIC METABOLIC PANEL
Anion gap: 12 (ref 5–15)
BUN: 17 mg/dL (ref 6–20)
CHLORIDE: 101 mmol/L (ref 101–111)
CO2: 24 mmol/L (ref 22–32)
Calcium: 9.6 mg/dL (ref 8.9–10.3)
Creatinine, Ser: 1.14 mg/dL (ref 0.61–1.24)
GFR calc Af Amer: >60 mL/min (ref >60)
GFR calc non Af Amer: >60 mL/min (ref >60)
GLUCOSE: 113 mg/dL — AB (ref 65–99)
POTASSIUM: 4.1 mmol/L (ref 3.5–5.1)
Sodium: 137 mmol/L (ref 135–145)

## 2016-03-24 LAB — I-STAT TROPONIN, ED
TROPONIN I, POC: 0 ng/mL (ref 0.00–0.08)
Troponin i, poc: 0 ng/mL (ref 0.00–0.08)

## 2016-03-24 MED ORDER — AMLODIPINE BESYLATE 5 MG PO TABS
5.0000 mg | ORAL_TABLET | Freq: Every day | ORAL | Status: DC
  Administered 2016-03-24: 5 mg via ORAL
  Filled 2016-03-24: qty 1

## 2016-03-24 MED ORDER — AMLODIPINE BESYLATE 5 MG PO TABS: 7.5000 mg | ORAL_TABLET | Freq: Every day | ORAL | 2 refills | Status: DC

## 2016-03-24 NOTE — Discharge Instructions (Signed)
Increase your Norvasc to 7.5 mg daily. Follow-up with your cardiologist to check on her blood pressure and the episode of chest pain. Return as needed for worsening symptoms.

## 2016-03-24 NOTE — ED Notes (Signed)
Pt ambulated to bathroom 

## 2016-03-24 NOTE — ED Triage Notes (Signed)
Patient complains of left anterior CP with radiation to left arm that started 1 hour pta, on assessment pain has resolved. States that he has had a cold and cough, NAD

## 2016-03-24 NOTE — ED Provider Notes (Signed)
[facility] DEPT Provider Note   CSN: [id number] Arrival date & time: 03/24/16  1545     History   Chief Complaint Chief Complaint  Patient presents with  . 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  HPI Patient presents to the emergency room for evaluation of chest pain. Patient was at home approximately 3:00 when he had sudden onset of some discomfort in his chest. Patient states the pain wasn't sharp or achy. It was primarily uncomfortable. He also noticed some discomfort in his left arm at the same time. The symptoms lasted less than a minute. He did take his blood pressure though after the episode of pain and it was elevated at 160/100. Normally his blood pressures in the [id number] systolic. This concerned him so he presented to the emergency room. He denies any trouble with any chest pain now. He has not had any shortness of breath. He denies any nausea. He does not have any history of heart disease or pulmonary embolism. Past Medical History:  Diagnosis Date  . Abnormal urinalysis    a. Tr Hgb by UA 02/2013 - instructed to f/u PCP.  . Diabetes mellitus   . Diverticulosis   . Essential hypertension   . Internal hemorrhoid   . PAF (paroxysmal atrial fibrillation) ([place])    a. Dx 02/2013. Placed on Cardizem, Eliquis (CHA2DS2VASc = 2);  b. 02/2013 Echo: EF 50-55%, mildly dil LA, mild LVH.  [name] Prostate cancer ([place])   . Tubular adenoma of colon     Patient Active Problem List   Diagnosis Date Noted  . Essential hypertension   . PAF (paroxysmal atrial fibrillation) ([place])   . Loose stools 03/09/2014  . Gas 03/09/2014  . Special screening for malignant neoplasms, colon 12/01/2013  . Chronic anticoagulation 12/01/2013  . Viral URI 02/11/2013  . Abnormal urinalysis   . Atrial fibrillation with rapid ventricular response ([place]) 02/10/2013  . Diabetes ([place]) 02/10/2013  . HTN (hypertension) 02/10/2013    Past Surgical History:  Procedure Laterality Date  .  CARDIOVERSION N/A 03/18/2013   Procedure: CARDIOVERSION;  Surgeon: Candee Furbish, MD;  Location[facility] ENDOSCOPY;  Service: Cardiovascular;  Laterality: N/A;  pt shocked x4, starting with 120 joules, 150, 200 and 299 joules without successful conversion...Dr. Marlou Porch will have pt follow up with cardiologists...  . PROSTATE SURGERY  2011       Home Medications    Prior to Admission medications   Medication Sig Start Date End Date Taking? Authorizing Provider  ACCU-CHEK AVIVA PLUS test strip  10/09/13  Yes Historical Provider, MD  acetaminophen (TYLENOL) 325 MG tablet Take 650 mg by mouth every 6 (six) hours as needed for mild pain.   Yes Historical Provider, MD  allopurinol (ZYLOPRIM) 300 MG tablet Take 300 mg by mouth daily.  08/01/13  Yes Historical Provider, MD  amoxicillin-clavulanate (AUGMENTIN) 875-125 MG tablet Take 1 tablet by mouth every 12 (twelve) hours. 03/22/16  Yes Historical Provider, MD  atorvastatin (LIPITOR) 40 MG tablet Take 40 mg by mouth daily.  02/15/13  Yes Historical Provider, MD  carvedilol (COREG) 12.5 MG tablet Take 1 tablet (12.5 mg total) by mouth 2 (two) times daily. 08/16/15  Yes Thayer Headings, MD  ELIQUIS 5 MG TABS tablet TAKE 1 TABLET BY MOUTH 2 TIMES DAILY 03/12/16  Yes Thayer Headings, MD  flecainide (TAMBOCOR) 50 MG tablet TAKE 1 TABLET TWICE A DAY 02/07/16  Yes Thayer Headings, MD  glimepiride [facility])  1 MG tablet Take 1 mg by mouth daily. 12/27/14  Yes Historical Provider, MD  hydrochlorothiazide (HYDRODIURIL) 25 MG tablet Take 1 tablet (25 mg total) by mouth daily. 02/12/16  Yes Thayer Headings, MD  KLOR-CON M10 10 MEQ tablet TAKE 1 TABLET DAILY 02/23/16  Yes Thayer Headings, MD  metFORMIN (GLUCOPHAGE) 1000 MG tablet Take 1,000 mg by mouth 2 (two) times daily. 04/25/14  Yes Historical Provider, MD  Jonetta Speak LANCETS [id number] [place]  05/20/13  Yes Historical Provider, MD  tamsulosin (FLOMAX) 0.4 MG CAPS capsule Take 0.4 mg by mouth daily as needed (for urinary frequency).    Yes Historical Provider, MD  amLODipine (NORVASC) 5 MG tablet Take 1.5 tablets (7.5 mg total) by mouth daily. 03/24/16   Dorie Rank, MD    Family History Family History  Problem Relation Age of Onset  . Diabetes Sister   . Heart attack Brother   . Hypertension    . Colon cancer Neg Hx   . Esophageal cancer Neg Hx   . Rectal cancer Neg Hx   . Stomach cancer Neg Hx     Social History Social History  Substance Use Topics  . Smoking status: Never Smoker  . Smokeless tobacco: Never Used  . Alcohol use 0.0 oz/week     Comment: occasionally     Allergies   Enalapril   Review of Systems Review of Systems  All other systems reviewed and are negative.    Physical Exam Updated Vital Signs BP 157/96   Pulse 67   Temp 97.8 F (36.6 C) (Oral)   Resp 19   Ht 5\' 11"  (1.803 m)   Wt 89.4 kg   SpO2 96%   BMI 27.48 kg/m  Hypertensive Physical Exam  Constitutional: He appears well-developed and well-nourished. No distress.  HENT:  Head: Normocephalic and atraumatic.  Right Ear: External ear normal.  Left Ear: External ear normal.  Eyes: Conjunctivae are normal. Right eye exhibits no discharge. Left eye exhibits no discharge. No scleral icterus.  Neck: Neck supple. No tracheal deviation present.  Cardiovascular: Normal rate, regular rhythm and intact distal pulses.   Pulmonary/Chest: Effort normal and breath sounds normal. No stridor. No respiratory distress. He has no wheezes. He has no rales.  Abdominal: Soft. Bowel sounds are normal. He exhibits no distension. There is no tenderness. There is no rebound and no guarding.  Musculoskeletal: He exhibits no edema or tenderness.  Neurological: He is alert. He has normal strength. No cranial nerve deficit (no facial droop, extraocular movements intact, no slurred speech) or sensory deficit. He exhibits normal muscle tone. He displays no seizure activity. Coordination normal.  Skin: Skin is warm and dry. No rash noted.  Psychiatric:  He has a normal mood and affect.  Nursing note and vitals reviewed.    ED Treatments / Results  Labs (all labs ordered are listed, but only abnormal results are displayed) Labs Reviewed  BASIC METABOLIC PANEL - Abnormal; Notable for the following:       Result Value   Glucose, Bld 113 (*)    All other components within normal limits  CBC  I-STAT TROPOININ, ED  I-STAT TROPOININ, ED    EKG  EKG Interpretation  Date/Time:  Sunday March 24 2016 15:53:50 EST Ventricular Rate:  80 PR Interval:  172 QRS Duration: 104 QT Interval:  394 QTC Calculation: 454 R Axis:   -33 Text Interpretation:  Normal sinus rhythm Left axis deviation Abnormal ECG atrial fibrillation noted on prior  ECG has resolved Confirmed by Catori Panozzo  MD-J, Emric Kowalewski [phone]) on 03/24/2016 5:15:54 PM       Radiology Dg Chest 2 View  Result Date: 03/24/2016 CLINICAL DATA:  Left upper chest pain radiates into left upper arm for 2 hours. EXAM: CHEST  2 VIEW COMPARISON:  03/22/2016 FINDINGS: The lungs are clear wiithout focal pneumonia, edema, pneumothorax or pleural effusion. Stable scarring left lung base. The cardiopericardial silhouette is within normal limits for size. The visualized bony structures of the thorax are intact. IMPRESSION: No active cardiopulmonary disease. Electronically Signed   By: Misty Stanley M.D.   On: 03/24/2016 16:52    Procedures Procedures (including critical care time)  Medications Ordered in ED Medications  amLODipine (NORVASC) tablet 5 mg (not administered)     Initial Impression / Assessment and Plan / ED Course  I have reviewed the triage vital signs and the nursing notes.  Pertinent labs & imaging results that were available during my care of the patient were reviewed by me and considered in my medical decision making (see chart for details).   patient had a brief episode of chest pain atypical for cardiac disease. Patient states he exercises regularly and does not have any chest  pain with exercise. The symptoms lasted less than a minute. Patient has been symptom free in the emergency room.   I doubt that this episode was related to acute coronary syndrome.  Patient is not having any dyspnea or shortness of breath. I doubt pulmonary embolism.  Patient was hypertensive in the emergency room. He states usually his blood pressure is much better controlled. I gave him a neck she does of Norvasc. I will have him increase his Norvasc to 7.5 mg daily and have him follow-up with his cardiologist.  Return as needed for worsening symptoms.   Final Clinical Impressions(s) / ED Diagnoses   Final diagnoses:  Chest pain, unspecified type  Hypertension, unspecified type    New Prescriptions Current Discharge Medication List       Dorie Rank, MD 03/24/16 2045

## 2016-04-05 ENCOUNTER — Encounter: Payer: Self-pay | Admitting: Cardiovascular Disease

## 2016-04-05 ENCOUNTER — Telehealth: Payer: Self-pay | Admitting: Nurse Practitioner

## 2016-04-05 ENCOUNTER — Ambulatory Visit (INDEPENDENT_AMBULATORY_CARE_PROVIDER_SITE_OTHER): Payer: BC Managed Care – PPO | Admitting: Cardiovascular Disease

## 2016-04-05 VITALS — BP 146/100 | HR 84 | Ht 71.0 in | Wt 199.6 lb

## 2016-04-05 DIAGNOSIS — I10 Essential (primary) hypertension: Secondary | ICD-10-CM

## 2016-04-05 DIAGNOSIS — I480 Paroxysmal atrial fibrillation: Secondary | ICD-10-CM

## 2016-04-05 MED ORDER — VALSARTAN 80 MG PO TABS: 80.0000 mg | ORAL_TABLET | Freq: Every day | ORAL | 11 refills | Status: DC

## 2016-04-05 NOTE — Patient Instructions (Signed)
Medication Instructions:  START Valsartan 80 mg once daily    Labwork: Your physician recommends that you return for lab work in: 3 weeks for basic metabolic panel   Testing/Procedures: None Ordered   Follow-Up: Your physician recommends that you schedule a follow-up appointment in: 3 months with Dr. Acie Fredrickson   If you need a refill on your cardiac medications before your next appointment, please call your pharmacy.   Thank you for choosing [facility]! Christen Bame, RN [phone]

## 2016-04-05 NOTE — Telephone Encounter (Signed)
3 boxes of Eliquis samples given to patient in office

## 2016-04-05 NOTE — Progress Notes (Signed)
.   Cardiology Office Note   Date:  04/05/2016   ID:  Damon Hector, PhD, DOB [birth year], MRN [id number]  PCP:  Gennette Pac, MD  Cardiologist:   Mertie Moores, MD   Chief Complaint  Patient presents with  . Follow-up    atrial fib   Problem list: 1. Essential hypertension 2. Diabetes mellitus 3. Prostate cancer 4. Paroxysmal Atrial fibrillation  Damon Krueger is a 66 y.o. 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 He was recently admitted 1/7-1/8 with AFib with RVR in the setting of URI (Flu negative). He was placed on CCB and Eliquis (CHADS2-VASc=2). CEs were negative. Echo (02/11/2013): Mild LVH, EF 50-55%, mild LAE. Plan was to proceed with DCCV if he did not convert to NSR on his own (? If viral illness resulted in AFib).   Feb. 13, 2015: He underwent cardioversion but unfortunately, he did not convert to sinus rhythm. His Cardizem was increased to 360 mg a day. He feels well. He's completely asymptomatic. He cannot tell that his heart rate is beating irregularly or not.  April 29, 2013:  The patient has failed cardioversion back in February ( as noted above) . We started him on flecainide and he converted to normal sinus rhythm. A stress Myoview study was negative for ischemia. He did not have any evidence of pro-arrhythmia. He's feeling quite well. He's walking about 20 miles a week.  July 14, 2013:  Carmin is doing well. Still walking 20 miles a week. He's not had any palpitations. He denies any chest pain or shortness of breath  Sept. 24, 2015:  [place] is doing . No arrhythmias. No CP. No dyspnea. Exercising regularly He measures his blood pressure at home on a regular basis. His typical readings were in the 110/73 range    April 08, 2014:   Damon Hector, PhD is a 66 y.o. male who presents for follow-up of his atrial fibrillation and hypertension. BP is typically much better at home.  BP is a bit high tonight  .   He complains of having "floaters" in his visual field.    [name]/ 10, 2017"  He developed an allergic reaction to the Enalapril.   It was stopped.  BP runs high on occasion Is back exercising - walks / jogs 25 miles a week .    August 16, 2015:  Doing well .   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  April 05, 2016:  Feeling ok BP has been elevated.  Actually very variable Does not eat salt.  Goes out to eat once a week - chooses a low salt option  Went to the ER 2 weeks ago Amlodipine was increased to 7.5 mg a day    Past Medical History:  Diagnosis Date  . Abnormal urinalysis    a. Tr Hgb by UA 02/2013 - instructed to f/u PCP.  . Diabetes mellitus   . Diverticulosis   . Essential hypertension   . Internal hemorrhoid   . PAF (paroxysmal atrial fibrillation) ([place])    a. Dx 02/2013. Placed on Cardizem, Eliquis (CHA2DS2VASc = 2);  b. 02/2013 Echo: EF 50-55%, mildly dil LA, mild LVH.  [name] Prostate cancer ([place])   . Tubular adenoma of colon     Past Surgical History:  Procedure Laterality Date  . CARDIOVERSION N/A 03/18/2013   Procedure: CARDIOVERSION;  Surgeon: Candee Furbish, MD;  Location: [facility] ENDOSCOPY;  Service: Cardiovascular;  Laterality: N/A;  pt shocked x4, starting with 120 joules, 150, 200 and 299 joules without successful conversion...Dr. Marlou Porch will have pt follow up with cardiologists...  . PROSTATE SURGERY  2011     Current Outpatient Prescriptions  Medication Sig Dispense Refill  . ACCU-CHEK AVIVA PLUS test strip     . acetaminophen (TYLENOL) 325 MG tablet Take 650 mg by mouth every 6 (six) hours as needed for mild pain.    [name] allopurinol (ZYLOPRIM) 300 MG tablet Take 300 mg by mouth daily.     [name] amLODipine (NORVASC) 5 MG tablet Take 1.5 tablets (7.5 mg total) by mouth daily. 90 tablet 2  . atorvastatin (LIPITOR) 40 MG tablet Take 40 mg by mouth daily.     . carvedilol (COREG) 12.5 MG tablet Take 1 tablet (12.5 mg  total) by mouth 2 (two) times daily. 180 tablet 3  . ELIQUIS 5 MG TABS tablet TAKE 1 TABLET BY MOUTH 2 TIMES DAILY 60 tablet 5  . flecainide (TAMBOCOR) 50 MG tablet TAKE 1 TABLET TWICE A DAY 180 tablet 2  . glimepiride (AMARYL) 1 MG tablet Take 1 mg by mouth daily.  0  . hydrochlorothiazide (HYDRODIURIL) 25 MG tablet Take 1 tablet (25 mg total) by mouth daily. 90 tablet 1  . KLOR-CON M10 10 MEQ tablet TAKE 1 TABLET DAILY 90 tablet 0  . metFORMIN (GLUCOPHAGE) 1000 MG tablet Take 1,000 mg by mouth 2 (two) times daily.    [name] DELICA LANCETS [id number] MISC     . tamsulosin (FLOMAX) 0.4 MG CAPS capsule Take 0.4 mg by mouth daily as needed (for urinary frequency).     No current facility-administered medications for this visit.     Allergies:   Enalapril    Social History:  The patient  reports that he has never smoked. He has never used smokeless tobacco. He reports that he drinks alcohol. He reports that he does not use drugs.   Family History:  The patient's family history includes Diabetes in his sister; Heart attack in his brother.    ROS:  Please see the history of present illness.    Review of Systems: Constitutional:  denies fever, chills, diaphoresis, appetite change and fatigue.  HEENT: denies photophobia, eye pain, redness, hearing loss, ear pain, congestion, sore throat, rhinorrhea, sneezing, neck pain, neck stiffness and tinnitus.  Respiratory: denies SOB, DOE, cough, chest tightness, and wheezing.  Cardiovascular: denies chest pain, palpitations and leg swelling.  Gastrointestinal: denies nausea, vomiting, abdominal pain, diarrhea, constipation, blood in stool.  Genitourinary: denies dysuria, urgency, frequency, hematuria, flank pain and difficulty urinating.  Musculoskeletal: denies  myalgias, back pain, joint swelling, arthralgias and gait problem.   Skin: denies pallor, rash and wound.  Neurological: denies dizziness, seizures, syncope, weakness, light-headedness,  numbness and headaches.   Hematological: denies adenopathy, easy bruising, personal or family bleeding history.  Psychiatric/ Behavioral: denies suicidal ideation, mood changes, confusion, nervousness, sleep disturbance and agitation.       All other systems are reviewed and negative.    PHYSICAL EXAM: VS:  BP (!) 146/100 (BP Location: Right Arm, Patient Position: Sitting, Cuff Size: Normal)   Pulse 84   Ht 5\' 11"  (1.803 m)   Wt 199 lb 9.6 oz (90.5 kg)   SpO2 93%   BMI 27.84 kg/m  , BMI Body mass index is 27.84 kg/m. GEN: Well nourished, well developed, in no acute distress  HEENT: normal  Neck: no JVD, carotid  bruits, or masses Cardiac: RRR; no murmurs, rubs, or gallops,no edema  Respiratory:  clear to auscultation bilaterally, normal work of breathing GI: soft, nontender, nondistended, + BS MS: no deformity or atrophy  Skin: warm and dry, no rash Neuro:  Strength and sensation are intact Psych: normal   EKG:  EKG is ordered today. The ekg ordered today demonstrates normal sinus rhythm at 65.   QRS duration is 116 ms.     Recent Labs: 03/24/2016: BUN 17; Creatinine, Ser 1.14; Hemoglobin 15.8; Platelets 151; Potassium 4.1; Sodium 137    Lipid Panel No results found for: CHOL, TRIG, HDL, CHOLHDL, VLDL, LDLCALC, LDLDIRECT    Wt Readings from Last 3 Encounters:  04/05/16 199 lb 9.6 oz (90.5 kg)  03/24/16 197 lb (89.4 kg)  08/16/15 204 lb 12.8 oz (92.9 kg)      Other studies Reviewed: Additional studies/ records that were reviewed today include: . Review of the above records demonstrates:    ASSESSMENT AND PLAN:  1.  Paroxysmal atrial fibrillation. Peydon is doing fairly well. He does describe some visual disturbances on Flecainide 100 mg BID  but tolerates 50 mg BID.    He is maintaining normal sinus rhythm. Continue current dose of flecainide.  2. Essential hypertension: Blood pressure is a little elevated on occasion Will add Valsartan 80 mg a day  3 weeks  for BMP 3 months follow up visit   Current medicines are reviewed at length with the patient today.  The patient does not have concerns regarding medicines.      Signed, Mertie Moores, MD  04/05/2016 1:59 PM    [facility] [place], [place], [place]  [address] Phone: [phone]; Fax: [phone]

## 2016-04-22 ENCOUNTER — Other Ambulatory Visit: Payer: BC Managed Care – PPO | Admitting: *Deleted

## 2016-04-22 DIAGNOSIS — I10 Essential (primary) hypertension: Secondary | ICD-10-CM

## 2016-04-22 DIAGNOSIS — I480 Paroxysmal atrial fibrillation: Secondary | ICD-10-CM

## 2016-04-22 LAB — BASIC METABOLIC PANEL
BUN/Creatinine Ratio: 20 (ref 10–24)
BUN: 19 mg/dL (ref 8–27)
CALCIUM: 9.3 mg/dL (ref 8.6–10.2)
CHLORIDE: 101 mmol/L (ref 96–106)
CO2: 21 mmol/L (ref 18–29)
Creatinine, Ser: 0.95 mg/dL (ref 0.76–1.27)
GFR calc non Af Amer: 85 mL/min/{1.73_m2} (ref >59)
GFR, EST AFRICAN AMERICAN: 98 mL/min/{1.73_m2} (ref >59)
Glucose: 149 mg/dL — ABNORMAL HIGH (ref 65–99)
POTASSIUM: 3.9 mmol/L (ref 3.5–5.2)
Sodium: 139 mmol/L (ref 134–144)

## 2016-04-23 ENCOUNTER — Telehealth: Payer: Self-pay | Admitting: Cardiovascular Disease

## 2016-04-23 NOTE — Telephone Encounter (Signed)
New message   Pt is calling to ask if lab work results are ready.

## 2016-04-23 NOTE — Telephone Encounter (Signed)
Left message to call back  

## 2016-04-24 ENCOUNTER — Telehealth: Payer: Self-pay | Admitting: Cardiovascular Disease

## 2016-04-24 MED ORDER — VALSARTAN 80 MG PO TABS: 80.0000 mg | ORAL_TABLET | Freq: Every day | ORAL | 3 refills | Status: DC

## 2016-04-24 NOTE — Telephone Encounter (Signed)
Follow Up:    Pt calling back from yesterday,you had called with his lab results.

## 2016-04-24 NOTE — Telephone Encounter (Signed)
Spoke with patient who is aware of lab results and would like a copy placed in mail to him. He also requests copy to PCP. I advised that I will be happy to oblige. He thanked me for the call.

## 2016-04-24 NOTE — Telephone Encounter (Signed)
Left detailed message of stable lab results and to continue current medications. I advised he may call back with questions or concerns.

## 2016-05-06 ENCOUNTER — Other Ambulatory Visit: Payer: Self-pay | Admitting: Cardiovascular Disease

## 2016-07-09 ENCOUNTER — Ambulatory Visit (INDEPENDENT_AMBULATORY_CARE_PROVIDER_SITE_OTHER): Payer: BC Managed Care – PPO | Admitting: Cardiovascular Disease

## 2016-07-09 ENCOUNTER — Encounter: Payer: Self-pay | Admitting: Cardiovascular Disease

## 2016-07-09 ENCOUNTER — Encounter (INDEPENDENT_AMBULATORY_CARE_PROVIDER_SITE_OTHER): Payer: Self-pay

## 2016-07-09 VITALS — BP 136/78 | HR 83 | Ht 71.0 in | Wt 205.8 lb

## 2016-07-09 DIAGNOSIS — I480 Paroxysmal atrial fibrillation: Secondary | ICD-10-CM

## 2016-07-09 DIAGNOSIS — I10 Essential (primary) hypertension: Secondary | ICD-10-CM

## 2016-07-09 DIAGNOSIS — E782 Mixed hyperlipidemia: Secondary | ICD-10-CM

## 2016-07-09 DIAGNOSIS — I4891 Unspecified atrial fibrillation: Secondary | ICD-10-CM | POA: Diagnosis not present

## 2016-07-09 NOTE — Progress Notes (Signed)
.   Cardiology Office Note   Date:  07/09/2016   ID:  Damon Krueger, Damon Krueger, DOB December 24, 1950, MRN [id number]  PCP:  Hulan Fess, MD  Cardiologist:   Mertie Moores, MD   Chief Complaint  Patient presents with  . Follow-up    atrial fib  . Hypertension   Problem list: 1. Essential hypertension 2. Diabetes mellitus 3. Prostate cancer 4. Paroxysmal Atrial fibrillation  Damon Krueger is a 66 y.o. 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 He was recently admitted 1/7-1/8 with AFib with RVR in the setting of URI (Flu negative). He was placed on CCB and Eliquis (CHADS2-VASc=2). CEs were negative. Echo (02/11/2013): Mild LVH, EF 50-55%, mild LAE. Plan was to proceed with DCCV if he did not convert to NSR on his own (? If viral illness resulted in AFib).   Feb. 13, 2015: He underwent cardioversion but unfortunately, he did not convert to sinus rhythm. His Cardizem was increased to 360 mg a day. He feels well. He's completely asymptomatic. He cannot tell that his heart rate is beating irregularly or not.  April 29, 2013:  The patient has failed cardioversion back in February ( as noted above) . We started him on flecainide and he converted to normal sinus rhythm. A stress Myoview study was negative for ischemia. He did not have any evidence of pro-arrhythmia. He's feeling quite well. He's walking about 20 miles a week.  July 14, 2013:  Damon Krueger is doing well. Still walking 20 miles a week. He's not had any palpitations. He denies any chest pain or shortness of breath  Sept. 24, 2015:  Damon Krueger is doing . No arrhythmias. No CP. No dyspnea. Exercising regularly He measures his blood pressure at home on a regular basis. His typical readings were in the 110/73 range    April 08, 2014:   Damon Krueger, Damon Krueger is a 66 y.o. male who presents for follow-up of his atrial fibrillation and hypertension. BP is typically much better at home.  BP is a bit  high tonight .   He complains of having "floaters" in his visual field.    [name]/ 10, 2017"  He developed an allergic reaction to the Enalapril.   It was stopped.  BP runs high on occasion Is back exercising - walks / jogs 25 miles a week .    August 16, 2015:  Doing well .   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  April 05, 2016:  Feeling ok BP has been elevated.  Actually very variable Does not eat salt.  Goes out to eat once a week - chooses a low salt option  Went to the ER 2 weeks ago Amlodipine was increased to 7.5 mg a day   July 09, 2016:  Has been doing well  We added Valsartan  Exercising some    Past Medical History:  Diagnosis Date  . Abnormal urinalysis    a. Tr Hgb by UA 02/2013 - instructed to f/u PCP.  . Diabetes mellitus   . Diverticulosis   . Essential hypertension   . Internal hemorrhoid   . PAF (paroxysmal atrial fibrillation) ([place])    a. Dx 02/2013. Placed on Cardizem, Eliquis (CHA2DS2VASc = 2);  b. 02/2013 Echo: EF 50-55%, mildly dil LA, mild LVH.  [name] Prostate cancer ([place])   . Tubular adenoma of colon     Past Surgical  History:  Procedure Laterality Date  . CARDIOVERSION N/A 03/18/2013   Procedure: CARDIOVERSION;  Surgeon: Candee Furbish, MD;  Location: [facility] ENDOSCOPY;  Service: Cardiovascular;  Laterality: N/A;  pt shocked x4, starting with 120 joules, 150, 200 and 299 joules without successful conversion...Dr. Marlou Porch will have pt follow up with cardiologists...  . PROSTATE SURGERY  2011     Current Outpatient Prescriptions  Medication Sig Dispense Refill  . ACCU-CHEK AVIVA PLUS test strip     . acetaminophen (TYLENOL) 325 MG tablet Take 650 mg by mouth every 6 (six) hours as needed for mild pain.    [name] allopurinol (ZYLOPRIM) 300 MG tablet Take 300 mg by mouth daily.     [name] amLODipine (NORVASC) 5 MG tablet Take 1.5 tablets (7.5 mg total) by mouth daily. 90 tablet 2  . atorvastatin (LIPITOR) 40 MG tablet  Take 40 mg by mouth daily.     . carvedilol (COREG) 12.5 MG tablet Take 1 tablet (12.5 mg total) by mouth 2 (two) times daily. 180 tablet 3  . ELIQUIS 5 MG TABS tablet TAKE 1 TABLET BY MOUTH 2 TIMES DAILY 60 tablet 5  . flecainide (TAMBOCOR) 50 MG tablet TAKE 1 TABLET TWICE A DAY 180 tablet 2  . glimepiride (AMARYL) 1 MG tablet Take 1 mg by mouth daily.  0  . hydrochlorothiazide (HYDRODIURIL) 25 MG tablet Take 1 tablet (25 mg total) by mouth daily. 90 tablet 1  . metFORMIN (GLUCOPHAGE) 1000 MG tablet Take 1,000 mg by mouth 2 (two) times daily.    Damon Krueger DELICA LANCETS 69C MISC     . potassium chloride (KLOR-CON M10) 10 MEQ tablet Take 1 tablet (10 mEq total) by mouth daily. 90 tablet 3  . tamsulosin (FLOMAX) 0.4 MG CAPS capsule Take 0.4 mg by mouth daily as needed (for urinary frequency).    . valsartan (DIOVAN) 80 MG tablet Take 1 tablet (80 mg total) by mouth daily. 90 tablet 3   No current facility-administered medications for this visit.     Allergies:   Enalapril    Social History:  The patient  reports that he has never smoked. He has never used smokeless tobacco. He reports that he drinks alcohol. He reports that he does not use drugs.   Family History:  The patient's family history includes Diabetes in his sister; Heart attack in his brother.    ROS:  Please see the history of present illness.    Review of Systems: Constitutional:  denies fever, chills, diaphoresis, appetite change and fatigue.  HEENT: denies photophobia, eye pain, redness, hearing loss, ear pain, congestion, sore throat, rhinorrhea, sneezing, neck pain, neck stiffness and tinnitus.  Respiratory: denies SOB, DOE, cough, chest tightness, and wheezing.  Cardiovascular: denies chest pain, palpitations and leg swelling.  Gastrointestinal: denies nausea, vomiting, abdominal pain, diarrhea, constipation, blood in stool.  Genitourinary: denies dysuria, urgency, frequency, hematuria, flank pain and difficulty  urinating.  Musculoskeletal: denies  myalgias, back pain, joint swelling, arthralgias and gait problem.   Skin: denies pallor, rash and wound.  Neurological: denies dizziness, seizures, syncope, weakness, light-headedness, numbness and headaches.   Hematological: denies adenopathy, easy bruising, personal or family bleeding history.  Psychiatric/ Behavioral: denies suicidal ideation, mood changes, confusion, nervousness, sleep disturbance and agitation.       All other systems are reviewed and negative.    PHYSICAL EXAM: VS:  BP 136/78   Pulse 83   Ht 5\' 11"  (1.803 m)   Wt 205 lb 12 oz (93.3 kg)  SpO2 96%   BMI 28.70 kg/m  , BMI Body mass index is 28.7 kg/m. GEN: Well nourished, well developed, in no acute distress  HEENT: normal  Neck: no JVD, carotid bruits, or masses Cardiac: RRR; no murmurs, rubs, or gallops,no edema  Respiratory:  clear to auscultation bilaterally, normal work of breathing GI: soft, nontender, nondistended, + BS MS: no deformity or atrophy  Skin: warm and dry, no rash Neuro:  Strength and sensation are intact Psych: normal   EKG:  EKG is not ordered today.   Recent Labs: 03/24/2016: Hemoglobin 15.8; Platelets 151 04/22/2016: BUN 19; Creatinine, Ser 0.95; Potassium 3.9; Sodium 139    Lipid Panel No results found for: CHOL, TRIG, HDL, CHOLHDL, VLDL, LDLCALC, LDLDIRECT    Wt Readings from Last 3 Encounters:  07/09/16 205 lb 12 oz (93.3 kg)  04/05/16 199 lb 9.6 oz (90.5 kg)  03/24/16 197 lb (89.4 kg)      Other studies Reviewed: Additional studies/ records that were reviewed today include: . Review of the above records demonstrates:    ASSESSMENT AND PLAN:  1.  Paroxysmal atrial fibrillation. Alton is doing fairly well. He does describe some visual disturbances on Flecainide 100 mg BID  but tolerates 50 mg BID.    He is maintaining normal sinus rhythm. Continue current dose of flecainide.  2. Essential hypertension:   BP has been well  controlled.  On  Valsartan 80 mg a day   Current medicines are reviewed at length with the patient today.  The patient does not have concerns regarding medicines.  Signed, Mertie Moores, MD  07/09/2016 11:07 AM    [facility] [place], [place], [place]  [address] Phone: [phone]; Fax: [phone]

## 2016-07-09 NOTE — Patient Instructions (Signed)

## 2016-07-28 ENCOUNTER — Other Ambulatory Visit: Payer: Self-pay | Admitting: Cardiovascular Disease

## 2016-08-12 ENCOUNTER — Telehealth: Payer: Self-pay | Admitting: Cardiovascular Disease

## 2016-08-12 ENCOUNTER — Other Ambulatory Visit: Payer: Self-pay | Admitting: Cardiovascular Disease

## 2016-08-12 NOTE — Telephone Encounter (Signed)
New message    Pt is calling about a possible recall about his valsartan. Please call.

## 2016-08-12 NOTE — Telephone Encounter (Signed)
Pt aware medication involved was not distributed in the [place] however pt should contact his pharmacy to verify the manufacturer.  He states understanding.

## 2016-08-20 ENCOUNTER — Telehealth: Payer: Self-pay | Admitting: Cardiovascular Disease

## 2016-08-20 MED ORDER — VALSARTAN 80 MG PO TABS: 80.0000 mg | ORAL_TABLET | Freq: Every day | ORAL | 3 refills | Status: DC

## 2016-08-20 NOTE — Telephone Encounter (Signed)
New message    Pt is calling about the Valsartan recall , are you going to replace with different medication

## 2016-08-20 NOTE — Telephone Encounter (Signed)
Spoke with patient who called to ask for new Rx to be sent to local [facility] because his current supply of valsartan from [facility] was manufactured by [employer] and was recalled. I spoke with [facility] at [facility] at location [facility] by [facility] and she verified that she does have Valsartan 80 mg available from other manufacturers. I advised that I am sending electronic refill and thanked her for her help.

## 2016-10-08 ENCOUNTER — Other Ambulatory Visit: Payer: Self-pay | Admitting: Cardiovascular Disease

## 2016-10-09 NOTE — Telephone Encounter (Signed)
Age 66 years Wt 93.3kg  07/09/2016 Saw Dr Acie Fredrickson 07/09/2016  04/22/2016 SrCr 0.95 03/24/2016 Hgb 15.8 HCT 45.6 Refill done for Eliquis 5mg  q 12 hours as requested

## 2016-10-26 ENCOUNTER — Other Ambulatory Visit: Payer: Self-pay | Admitting: Cardiovascular Disease

## 2016-11-06 ENCOUNTER — Telehealth: Payer: Self-pay | Admitting: Cardiovascular Disease

## 2016-11-06 NOTE — Telephone Encounter (Signed)
°  New Prob  Pt has some questions and concerns regarding Valsartan.  Pt c/o medication issue:  1. Name of Medication: Valsartan 80 mg  2. How are you currently taking this medication (dosage and times per day)? Once daily  3. Are you having a reaction (difficulty breathing--STAT)? No  4. What is your medication issue? Pt has some questions and concerns regarding Valsartan.

## 2016-11-06 NOTE — Telephone Encounter (Signed)
Patient called to say that there is a recall for valsartan with the manufacturer that he was switched to back in July. Please advise.

## 2016-11-06 NOTE — Telephone Encounter (Signed)
Returned call to pt. He got a refill on his valsartan 2 days ago that was from a different manufacturer. Advised pt his new supply should be ok and that [facility] would have [facility] if his supply had been affected. I advised him to [facility] with [facility] if he has any other concerns since they have details regarding which manufacturer pt received. He verbalized understanding.

## 2017-01-13 ENCOUNTER — Other Ambulatory Visit: Payer: BC Managed Care – PPO

## 2017-01-15 ENCOUNTER — Other Ambulatory Visit: Payer: BC Managed Care – PPO | Admitting: *Deleted

## 2017-01-15 DIAGNOSIS — I480 Paroxysmal atrial fibrillation: Secondary | ICD-10-CM

## 2017-01-15 DIAGNOSIS — E782 Mixed hyperlipidemia: Secondary | ICD-10-CM

## 2017-01-15 LAB — COMPREHENSIVE METABOLIC PANEL
ALT: 16 IU/L (ref 0–44)
AST: 18 IU/L (ref 0–40)
Albumin/Globulin Ratio: 1.6 (ref 1.2–2.2)
Albumin: 4.1 g/dL (ref 3.6–4.8)
Alkaline Phosphatase: 55 IU/L (ref 39–117)
BUN/Creatinine Ratio: 22 (ref 10–24)
BUN: 23 mg/dL (ref 8–27)
Bilirubin Total: 0.5 mg/dL (ref 0.0–1.2)
CALCIUM: 9.6 mg/dL (ref 8.6–10.2)
CHLORIDE: 98 mmol/L (ref 96–106)
CO2: 29 mmol/L (ref 20–29)
Creatinine, Ser: 1.04 mg/dL (ref 0.76–1.27)
GFR calc Af Amer: 87 mL/min/{1.73_m2} (ref >59)
GFR calc non Af Amer: 76 mL/min/{1.73_m2} (ref >59)
GLOBULIN, TOTAL: 2.6 g/dL (ref 1.5–4.5)
Glucose: 119 mg/dL — ABNORMAL HIGH (ref 65–99)
POTASSIUM: 4.2 mmol/L (ref 3.5–5.2)
Sodium: 140 mmol/L (ref 134–144)
TOTAL PROTEIN: 6.7 g/dL (ref 6.0–8.5)

## 2017-01-17 ENCOUNTER — Telehealth: Payer: Self-pay | Admitting: Cardiovascular Disease

## 2017-01-17 ENCOUNTER — Ambulatory Visit: Payer: BC Managed Care – PPO | Admitting: Cardiovascular Disease

## 2017-01-17 LAB — LIPID PANEL
CHOL/HDL RATIO: 2.3 ratio (ref 0.0–5.0)
CHOLESTEROL TOTAL: 164 mg/dL (ref 100–199)
HDL: 71 mg/dL (ref >39)
LDL CALC: 82 mg/dL (ref 0–99)
TRIGLYCERIDES: 53 mg/dL (ref 0–149)
VLDL CHOLESTEROL CAL: 11 mg/dL (ref 5–40)

## 2017-01-17 LAB — SPECIMEN STATUS REPORT

## 2017-01-17 NOTE — Telephone Encounter (Signed)
Reviewed lab results with patient who verbalized understanding. He requests copies to Dr. Rex Kras, PCP and mailed to himself. He thanked me for the call.

## 2017-01-17 NOTE — Telephone Encounter (Signed)
New message   Patient calling regarding lab results. Please call on mobile.

## 2017-01-31 ENCOUNTER — Encounter: Payer: Self-pay | Admitting: Gastroenterology

## 2017-02-25 ENCOUNTER — Telehealth: Payer: Self-pay | Admitting: Emergency Medicine

## 2017-02-25 ENCOUNTER — Ambulatory Visit: Payer: BC Managed Care – PPO | Admitting: Gastroenterology

## 2017-02-25 ENCOUNTER — Encounter: Payer: Self-pay | Admitting: Gastroenterology

## 2017-02-25 VITALS — BP 108/76 | HR 80 | Ht 71.0 in | Wt 210.4 lb

## 2017-02-25 DIAGNOSIS — Z860100 Personal history of colon polyps, unspecified: Secondary | ICD-10-CM

## 2017-02-25 DIAGNOSIS — Z7901 Long term (current) use of anticoagulants: Secondary | ICD-10-CM | POA: Diagnosis not present

## 2017-02-25 HISTORY — DX: Personal history of colonic polyps: Z86.010

## 2017-02-25 HISTORY — DX: Personal history of colon polyps, unspecified: Z86.0100

## 2017-02-25 MED ORDER — NA SULFATE-K SULFATE-MG SULF 17.5-3.13-1.6 GM/177ML PO SOLN: 1.0000 | ORAL | 0 refills | Status: DC

## 2017-02-25 NOTE — Patient Instructions (Signed)
You have been scheduled for a colonoscopy. Please follow written instructions given to you at your visit today.  Please pick up your prep supplies at the pharmacy within the next 1-3 days. If you use inhalers (even only as needed), please bring them with you on the day of your procedure. Your physician has requested that you go to www.startemmi.com and enter the access code given to you at your visit today. This web site gives a general overview about your procedure. However, you should still follow specific instructions given to you by our office regarding your preparation for the procedure.  Please purchase the following medications over the counter and take as directed: FD gard

## 2017-02-25 NOTE — Progress Notes (Signed)
02/25/2017 Damon Hector, PhD [id number] 1950/10/18   HISTORY OF PRESENT ILLNESS:  This is a 67 year old male who is know to Dr. Fuller Plan.  He is here today to schedule recall colonoscopy.  Last was in 01/2017 at which time he had a tubular adenoma and a serrated adenoma removed.  The serrated adenoma was removed from the transverse colon and was 10 mm in size, therefore, it was recommended that he have another colonoscopy in 3 years from that time.  He is on Eliquis that is prescribed by Dr. Acie Fredrickson for his atrial fibrillation.  Has been on that since 2015 with no new cardiac issues.  He denies black or bloody stools, abdominal pain, unintentional weight loss, change in bowel habits, etc.  Weight is stable from 3 years ago.  He does admit to some upper gas/indigestion for which he takes Zantac prn 2 times per week or so with good results, but is asking if there is anything better that he should try.   Past Medical History:  Diagnosis Date  . Abnormal urinalysis    a. Tr Hgb by UA 02/2013 - instructed to f/u PCP.  . Diabetes mellitus   . Diverticulosis   . Essential hypertension   . Internal hemorrhoid   . PAF (paroxysmal atrial fibrillation) ([place])    a. Dx 02/2013. Placed on Cardizem, Eliquis (CHA2DS2VASc = 2);  b. 02/2013 Echo: EF 50-55%, mildly dil LA, mild LVH.  [name] Prostate cancer ([place])   . Tubular adenoma of colon    Past Surgical History:  Procedure Laterality Date  . CARDIOVERSION N/A 03/18/2013   Procedure: CARDIOVERSION;  Surgeon: Candee Furbish, MD;  Location: [facility] ENDOSCOPY;  Service: Cardiovascular;  Laterality: N/A;  pt shocked x4, starting with 120 joules, 150, 200 and 299 joules without successful conversion...Dr. Marlou Porch will have pt follow up with cardiologists...  . PROSTATE SURGERY  2011    reports that  has never smoked. he has never used smokeless tobacco. He reports that he drinks alcohol. He reports that he does not use drugs. family history includes Diabetes in his  sister; Heart attack in his brother; Hypertension in his unknown relative. Allergies  Allergen Reactions  . Enalapril Swelling      Outpatient Encounter Medications as of 02/25/2017  Medication Sig  . ACCU-CHEK AVIVA PLUS test strip   . acetaminophen (TYLENOL) 325 MG tablet Take 650 mg by mouth every 6 (six) hours as needed for mild pain.  [name] allopurinol (ZYLOPRIM) 300 MG tablet Take 300 mg by mouth daily.   [name] amLODipine (NORVASC) 5 MG tablet Take 1.5 tablets (7.5 mg total) by mouth daily.  [name] atorvastatin (LIPITOR) 40 MG tablet Take 40 mg by mouth daily.   . carvedilol (COREG) 12.5 MG tablet Take 1 tablet (12.5 mg total) by mouth 2 (two) times daily.  [name] ELIQUIS 5 MG TABS tablet TAKE 1 TABLET BY MOUTH TWICE A DAY  . flecainide (TAMBOCOR) 50 MG tablet TAKE 1 TABLET TWICE A DAY  . glimepiride (AMARYL) 1 MG tablet Take 1 mg by mouth daily.  . hydrochlorothiazide (HYDRODIURIL) 25 MG tablet Take 1 tablet (25 mg total) by mouth daily.  . metFORMIN (GLUCOPHAGE) 1000 MG tablet Take 1,000 mg by mouth 2 (two) times daily.  [name] DELICA LANCETS 50P MISC   . potassium chloride (KLOR-CON M10) 10 MEQ tablet Take 1 tablet (10 mEq total) by mouth daily.  . tamsulosin (FLOMAX) 0.4 MG CAPS capsule Take 0.4 mg by mouth  daily as needed (for urinary frequency).  . valsartan (DIOVAN) 80 MG tablet Take 1 tablet (80 mg total) by mouth daily.   No facility-administered encounter medications on file as of 02/25/2017.      REVIEW OF SYSTEMS  : All other systems reviewed and negative except where noted in the History of Present Illness.   PHYSICAL EXAM: BP 108/76   Pulse 80   Ht 5\' 11"  (1.803 m)   Wt 210 lb 6 oz (95.4 kg)   BMI 29.34 kg/m  General: Well developed black male in no acute distress Head: Normocephalic and atraumatic Eyes:  Sclerae anicteric, conjunctiva pink. Ears: Normal auditory acuity Lungs: Clear throughout to auscultation; no increased WOB. Heart: Regular rate and rhythm; no  M/R/G. Abdomen: Soft, non-distended.  BS present.  Non-tender.   Rectal:  Will be done at the time of colonoscopy. Musculoskeletal: Symmetrical with no gross deformities  Skin: No lesions on visible extremities Extremities: No edema  Neurological: Alert oriented x 4, grossly non-focal Psychological:  Alert and cooperative. Normal mood and affect  ASSESSMENT AND PLAN: *Personal history of colon polyps:  Last colonoscopy 01/2014 with TA and serrated adenoma.  Repeat recommended in 3 years.  Will schedule with Dr. Fuller Plan. *Chronic anticoagulation:  Will hold Eliquis for 2 days prior to endoscopic procedures - will instruct when and how to resume after procedure. Benefits and risks of procedure explained including risks of bleeding, perforation, infection, missed lesions, reactions to medications and possible need for hospitalization and surgery for complications. Additional rare but real risk of stroke or other vascular clotting events off Eliquis also explained and need to seek urgent help if any signs of these problems occur. Will communicate by phone or EMR with patient's prescribing provider, Dr. Acie Fredrickson, to confirm that holding Eliquis is reasonable in this case.  *Indigestion:  Uses Zantac prn 2 times a week or so with good results.  Can continue that for now.  Could try FDgard prn as well for "dyspepsia"-coupon given.   CC:  Hulan Fess, MD

## 2017-02-25 NOTE — Telephone Encounter (Signed)
   [name], PhD 09-03-1950 [id number]   We have scheduled the above named patient for a colonoscopy procedure. Our records show that he is on anticoagulation therapy.  Please advise as to whether the patient may come off their therapy of Eliquis 2 days prior to their procedure which is scheduled for 03-28-17.  Please route your response to Tinnie Gens, CMA or fax response to [phone].  Sincerely,    [facility]

## 2017-02-25 NOTE — Progress Notes (Signed)
Reviewed and agree with management plan.  Khylie Larmore T. Lannie Heaps, MD FACG 

## 2017-02-26 ENCOUNTER — Telehealth: Payer: Self-pay | Admitting: Cardiovascular Disease

## 2017-02-26 NOTE — Telephone Encounter (Signed)
New message ° °Pt verbalized that he is returning call for RN °

## 2017-02-26 NOTE — Telephone Encounter (Signed)
Spoke with patient who states he was returning a call from our office. I advised that I did not call him but that I can see that our scheduling department has tried to call. I attempted to reach our scheduler and left her a message that the patient was returning the call. I advised the patient that I was unable to reach the department that called him and apologized for the inconvenience. He stated it was no problem and thanked me for the call.

## 2017-02-26 NOTE — Telephone Encounter (Signed)
Faxed to [facility].  See previous note.

## 2017-02-26 NOTE — Telephone Encounter (Signed)
Left message for patient

## 2017-02-26 NOTE — Telephone Encounter (Signed)
Patient with diagnosis of atrial fibrillation on Eliquis for anticoagulation.    Procedure: colonoscopy Date of procedure: 03/28/17  CHADS2-VASc score of  2 ( HTN, DM2)  CrCl 96.8 Platelet count 151  Per office protocol, patient can hold Eliquis for 2 days prior to procedure.    Patient will not need bridging with Lovenox (enoxaparin) around procedure.  Patient should restart Eliquis on the evening of procedure or day after, at discretion of procedure MD

## 2017-02-27 NOTE — Telephone Encounter (Signed)
Patient returning call.

## 2017-02-28 NOTE — Telephone Encounter (Signed)
Pt said he is returning your call [phone]

## 2017-02-28 NOTE — Telephone Encounter (Signed)
Left message for patient to call office.  

## 2017-02-28 NOTE — Telephone Encounter (Signed)
Spoke to patient and informed him to take his last dose of eliquis on 03-25-17 and hold it until his procedure. Pt verbalized understanding. He is aware that after his procedure his provider will inform him when to restart.

## 2017-03-14 ENCOUNTER — Encounter: Payer: Self-pay | Admitting: Gastroenterology

## 2017-03-18 ENCOUNTER — Encounter: Payer: Self-pay | Admitting: Cardiovascular Disease

## 2017-03-18 ENCOUNTER — Ambulatory Visit: Payer: BC Managed Care – PPO | Admitting: Cardiovascular Disease

## 2017-03-18 VITALS — BP 118/90 | HR 64 | Ht 71.0 in | Wt 210.0 lb

## 2017-03-18 DIAGNOSIS — E782 Mixed hyperlipidemia: Secondary | ICD-10-CM | POA: Diagnosis not present

## 2017-03-18 DIAGNOSIS — I480 Paroxysmal atrial fibrillation: Secondary | ICD-10-CM | POA: Diagnosis not present

## 2017-03-18 DIAGNOSIS — I10 Essential (primary) hypertension: Secondary | ICD-10-CM | POA: Diagnosis not present

## 2017-03-18 NOTE — Patient Instructions (Signed)

## 2017-03-18 NOTE — Progress Notes (Signed)
.   Cardiology Office Note   Date:  03/18/2017   ID:  Damon Hector, PhD, DOB 1951/01/12, MRN [id number]  PCP:  Hulan Fess, MD  Cardiologist:   Mertie Moores, MD   Chief Complaint  Patient presents with  . Follow-up    HTN, PAF    Problem list: 1. Essential hypertension 2. Diabetes mellitus 3. Prostate cancer 4. Paroxysmal Atrial fibrillation  Damon Krueger is a 67 y.o. male [place] business professor at [employer] with a hx of HTN, DM, prostate CA. He was recently admitted 1/7-1/8 with AFib with RVR in the setting of URI (Flu negative). He was placed on CCB and Eliquis (CHADS2-VASc=2). CEs were negative. Echo (02/11/2013): Mild LVH, EF 50-55%, mild LAE. Plan was to proceed with DCCV if he did not convert to NSR on his own (? If viral illness resulted in AFib).   Feb. 13, 2015: He underwent cardioversion but unfortunately, he did not convert to sinus rhythm. His Cardizem was increased to 360 mg a day. He feels well. He's completely asymptomatic. He cannot tell that his heart rate is beating irregularly or not.  April 29, 2013:  The patient has failed cardioversion back in February ( as noted above) . We started him on flecainide and he converted to normal sinus rhythm. A stress Myoview study was negative for ischemia. He did not have any evidence of pro-arrhythmia. He's feeling quite well. He's walking about 20 miles a week.  July 14, 2013:  Damon Krueger is doing well. Still walking 20 miles a week. He's not had any palpitations. He denies any chest pain or shortness of breath  Sept. 24, 2015:  Damon Krueger is doing . No arrhythmias. No CP. No dyspnea. Exercising regularly He measures his blood pressure at home on a regular basis. His typical readings were in the 110/73 range    April 08, 2014:   Damon Hector, PhD is a 67 y.o. male who presents for follow-up of his atrial fibrillation and hypertension. BP is typically much better at home.  BP is a bit high tonight .     He complains of having "floaters" in his visual field.    [name]/ 10, 2017"  He developed an allergic reaction to the Enalapril.   It was stopped.  BP runs high on occasion Is back exercising - walks / jogs 25 miles a week .    August 16, 2015:  Doing well .   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  April 05, 2016:  Feeling ok BP has been elevated.  Actually very variable Does not eat salt.  Goes out to eat once a week - chooses a low salt option  Went to the ER 2 weeks ago Amlodipine was increased to 7.5 mg a day   July 09, 2016:  Has been doing well  We added Valsartan  Exercising some   March 18, 2017:  As a as seen today for for follow-up of his paroxysmal atrial fibrillation and hypertension. Notes occasional palpitations - last for several minutes.  No associated dizziness or sweats or  CP or dyspnea  Occurs when he is under stress Exercising regularly .    Past Medical History:  Diagnosis Date  . Abnormal urinalysis    a. Tr Hgb by UA 02/2013 - instructed to f/u PCP.  . Diabetes mellitus   . Diverticulosis   . Essential hypertension   . Internal hemorrhoid   .  PAF (paroxysmal atrial fibrillation) ([place])    a. Dx 02/2013. Placed on Cardizem, Eliquis (CHA2DS2VASc = 2);  b. 02/2013 Echo: EF 50-55%, mildly dil LA, mild LVH.  Damon Krueger Prostate cancer ([place])   . Tubular adenoma of colon     Past Surgical History:  Procedure Laterality Date  . CARDIOVERSION N/A 03/18/2013   Procedure: CARDIOVERSION;  Surgeon: Candee Furbish, MD;  Location: [facility] ENDOSCOPY;  Service: Cardiovascular;  Laterality: N/A;  pt shocked x4, starting with 120 joules, 150, 200 and 299 joules without successful conversion...Dr. Marlou Porch will have pt follow up with cardiologists...  . PROSTATE SURGERY  2011     Current Outpatient Medications  Medication Sig Dispense Refill  . ACCU-CHEK AVIVA PLUS test strip     . acetaminophen (TYLENOL) 325 MG tablet Take 650 mg  by mouth every 6 (six) hours as needed for mild pain.    Damon Krueger allopurinol (ZYLOPRIM) 300 MG tablet Take 300 mg by mouth daily.     Damon Krueger amLODipine (NORVASC) 5 MG tablet Take 5 mg by mouth daily.    Damon Krueger atorvastatin (LIPITOR) 40 MG tablet Take 40 mg by mouth daily.     . carvedilol (COREG) 12.5 MG tablet Take 1 tablet (12.5 mg total) by mouth 2 (two) times daily. 180 tablet 3  . ELIQUIS 5 MG TABS tablet TAKE 1 TABLET BY MOUTH TWICE A DAY 60 tablet 5  . flecainide (TAMBOCOR) 50 MG tablet TAKE 1 TABLET TWICE A DAY 180 tablet 2  . glimepiride (AMARYL) 1 MG tablet Take 1 mg by mouth daily.  0  . hydrochlorothiazide (HYDRODIURIL) 25 MG tablet Take 1 tablet (25 mg total) by mouth daily. 90 tablet 3  . metFORMIN (GLUCOPHAGE) 1000 MG tablet Take 1,000 mg by mouth 2 (two) times daily.    [name] DELICA LANCETS 20U MISC     . potassium chloride (KLOR-CON M10) 10 MEQ tablet Take 1 tablet (10 mEq total) by mouth daily. 90 tablet 3  . tamsulosin (FLOMAX) 0.4 MG CAPS capsule Take 0.4 mg by mouth daily as needed (for urinary frequency).    . valsartan (DIOVAN) 80 MG tablet Take 1 tablet (80 mg total) by mouth daily. 90 tablet 3   No current facility-administered medications for this visit.     Allergies:   Enalapril    Social History:  The patient  reports that  has never smoked. he has never used smokeless tobacco. He reports that he drinks alcohol. He reports that he does not use drugs.   Family History:  The patient's family history includes Diabetes in his sister; Heart attack in his brother; Hypertension in his unknown relative.    ROS:  As per current hx, otherwise all systems are negative   Physical Exam: Blood pressure 118/90, pulse 64, height 5\' 11"  (1.803 m), weight 210 lb (95.3 kg).  GEN:  Well nourished, well developed in no acute distress HEENT: Normal NECK: No JVD; No carotid bruits LYMPHATICS: No lymphadenopathy CARDIAC: RR, no murmurs, rubs, gallops RESPIRATORY:  Clear to auscultation  without rales, wheezing or rhonchi  ABDOMEN: Soft, non-tender, non-distended MUSCULOSKELETAL:  No edema; No deformity  SKIN: Warm and dry NEUROLOGIC:  Alert and oriented x 3    EKG: Feb. 12, 2019: Normal sinus rhythm at 64 beats a minute.  No ST or T wave changes.   Recent Labs: 03/24/2016: Hemoglobin 15.8; Platelets 151 01/15/2017: ALT 16; BUN 23; Creatinine, Ser 1.04; Potassium 4.2; Sodium 140    Lipid Panel  Component Value Date/Time   CHOL 164 01/15/2017 1101   TRIG 53 01/15/2017 1101   HDL 71 01/15/2017 1101   CHOLHDL 2.3 01/15/2017 1101   LDLCALC 82 01/15/2017 1101      Wt Readings from Last 3 Encounters:  03/18/17 210 lb (95.3 kg)  02/25/17 210 lb 6 oz (95.4 kg)  07/09/16 205 lb 12 oz (93.3 kg)      Other studies Reviewed: Additional studies/ records that were reviewed today include: . Review of the above records demonstrates:    ASSESSMENT AND PLAN:  1.  Paroxysmal atrial fibrillation. Emidio is doing fairly well. He does describe some visual disturbances on Flecainide 100 mg BID  but tolerates 50 mg BID.    ECG shows no QRS widening     2. Essential hypertension:    BP is well controlled We discussed the recalls of Losartan and other ARBS. We discussed the benefits of ARBs in in diabetics   3.  Hyperlipidemia: Continue atorvastatin 20 mg a day.  His lipid levels look good.  We will check him again in 6 months when I see him again in the office.  He is doing well.  Continue current meds.   Current medicines are reviewed at length with the patient today.  The patient does not have concerns regarding medicines.  Signed, Mertie Moores, MD  03/18/2017 8:58 AM    [address], [place], [place]  [address] Phone: [phone]; Fax: [phone]

## 2017-03-28 ENCOUNTER — Other Ambulatory Visit: Payer: Self-pay

## 2017-03-28 ENCOUNTER — Encounter: Payer: Self-pay | Admitting: Gastroenterology

## 2017-03-28 ENCOUNTER — Ambulatory Visit (AMBULATORY_SURGERY_CENTER): Payer: BC Managed Care – PPO | Admitting: Gastroenterology

## 2017-03-28 VITALS — BP 112/82 | HR 49 | Temp 96.4°F | Resp 15 | Ht 71.0 in | Wt 210.0 lb

## 2017-03-28 DIAGNOSIS — D123 Benign neoplasm of transverse colon: Secondary | ICD-10-CM

## 2017-03-28 DIAGNOSIS — D124 Benign neoplasm of descending colon: Secondary | ICD-10-CM | POA: Diagnosis not present

## 2017-03-28 DIAGNOSIS — Z86010 Personal history of colonic polyps: Secondary | ICD-10-CM

## 2017-03-28 DIAGNOSIS — D120 Benign neoplasm of cecum: Secondary | ICD-10-CM | POA: Diagnosis not present

## 2017-03-28 DIAGNOSIS — K635 Polyp of colon: Secondary | ICD-10-CM | POA: Diagnosis not present

## 2017-03-28 DIAGNOSIS — D129 Benign neoplasm of anus and anal canal: Secondary | ICD-10-CM

## 2017-03-28 DIAGNOSIS — D128 Benign neoplasm of rectum: Secondary | ICD-10-CM | POA: Diagnosis not present

## 2017-03-28 MED ORDER — SODIUM CHLORIDE 0.9 % IV SOLN: 500.0000 mL | Freq: Once | INTRAVENOUS | Status: DC

## 2017-03-28 NOTE — Progress Notes (Signed)
Called to room to assist during endoscopic procedure.  Patient ID and intended procedure confirmed with present staff. Received instructions for my participation in the procedure from the performing physician.  

## 2017-03-28 NOTE — Patient Instructions (Signed)
YOU HAD AN ENDOSCOPIC PROCEDURE TODAY AT [facility]:   Refer to the procedure report that was given to you for any specific questions about what was found during the examination.  If the procedure report does not answer your questions, please call your gastroenterologist to clarify.  If you requested that your care partner not be given the details of your procedure findings, then the procedure report has been included in a sealed envelope for you to review at your convenience later.  YOU SHOULD EXPECT: Some feelings of bloating in the abdomen. Passage of more gas than usual.  Walking can help get rid of the air that was put into your GI tract during the procedure and reduce the bloating. If you had a lower endoscopy (such as a colonoscopy or flexible sigmoidoscopy) you may notice spotting of blood in your stool or on the toilet paper. If you underwent a bowel prep for your procedure, you may not have a normal bowel movement for a few days.  Please Note:  You might notice some irritation and congestion in your nose or some drainage.  This is from the oxygen used during your procedure.  There is no need for concern and it should clear up in a day or so.  SYMPTOMS TO REPORT IMMEDIATELY:   Following lower endoscopy (colonoscopy or flexible sigmoidoscopy):  Excessive amounts of blood in the stool  Significant tenderness or worsening of abdominal pains  Swelling of the abdomen that is new, acute  Fever of 100F or higher   For urgent or emergent issues, a gastroenterologist can be reached at any hour by calling [phone].   DIET:  We do recommend a small meal at first, but then you may proceed to your regular diet.  Drink plenty of fluids but you should avoid alcoholic beverages for 24 hours.  ACTIVITY:  You should plan to take it easy for the rest of today and you should NOT DRIVE or use heavy machinery until tomorrow (because of the sedation medicines used during the test).     FOLLOW UP: Our staff will call the number listed on your records the next business day following your procedure to check on you and address any questions or concerns that you may have regarding the information given to you following your procedure. If we do not reach you, we will leave a message.  However, if you are feeling well and you are not experiencing any problems, there is no need to return our call.  We will assume that you have returned to your regular daily activities without incident.  If any biopsies were taken you will be contacted by phone or by letter within the next 1-3 weeks.  Please call us at [phone] if you have not heard about the biopsies in 3 weeks.   No NSAIDS for two weeks post procedure.  Resume your Eliquis tomorrow per Dr. Fuller Plan.   SIGNATURES/CONFIDENTIALITY: You and/or your care partner have signed paperwork which will be entered into your electronic medical record.  These signatures attest to the fact that that the information above on your After Visit Summary has been reviewed and is understood.  Full responsibility of the confidentiality of this discharge information lies with you and/or your care-partner.

## 2017-03-28 NOTE — Progress Notes (Signed)
No egg or soy allergy Last dose of Eliquis was 2-19 Tuesday

## 2017-03-28 NOTE — Progress Notes (Signed)
A and O x3. Report to RN. Tolerated MAC anesthesia well.

## 2017-03-28 NOTE — Op Note (Signed)
[facility] Patient Name: Damon Krueger Procedure Date: 03/28/2017 1:55 PM MRN: [id number] Endoscopist: Ladene Artist , MD Age: 67 Referring MD:  Date of Birth: [birth year] Gender: Male Account #: [id number] Procedure:                Colonoscopy Indications:              Surveillance: Personal history of adenomatous                            polyps on last colonoscopy 3 years ago Medicines:                Monitored Anesthesia Care Procedure:                Pre-Anesthesia Assessment:                           - Prior to the procedure, a History and Physical                            was performed, and patient medications and                            allergies were reviewed. The patient's tolerance of                            previous anesthesia was also reviewed. The risks                            and benefits of the procedure and the sedation                            options and risks were discussed with the patient.                            All questions were answered, and informed consent                            was obtained. Prior Anticoagulants: The patient has                            taken Eliquis (apixaban), last dose was 2 days                            prior to procedure. ASA Grade Assessment: III - A                            patient with severe systemic disease. After                            reviewing the risks and benefits, the patient was                            deemed in satisfactory condition to undergo the  procedure.                           After obtaining informed consent, the colonoscope                            was passed under direct vision. Throughout the                            procedure, the patient's blood pressure, pulse, and                            oxygen saturations were monitored continuously. The                            Colonoscope was introduced through the anus and                advanced to the the cecum, identified by                            appendiceal orifice and ileocecal valve. The                            ileocecal valve, appendiceal orifice, and rectum                            were photographed. The quality of the bowel                            preparation was adequate after extensive lavage and                            suctioning. The patient tolerated the procedure                            well. The colonoscopy was somewhat difficult due to                            significant looping and a tortuous colon.                            Successful completion of the procedure was aided by                            changing the patient's position, withdrawing and                            reinserting the scope, straightening and shortening                            the scope to obtain bowel loop reduction, using                            scope torsion and applying abdominal pressure. Scope In: 1:59:37 PM Scope Out:  2:27:25 PM Scope Withdrawal Time: 0 hours 13 minutes 40 seconds  Total Procedure Duration: 0 hours 27 minutes 48 seconds  Findings:                 The perianal and digital rectal examinations were                            normal.                           A 5 mm polyp was found in the cecum. The polyp was                            sessile. The polyp was removed with a cold biopsy                            forceps. Resection and retrieval were complete.                           Three sessile polyps were found in the rectum,                            descending colon and hepatic flexure. The polyps                            were 6 to 8 mm in size. These polyps were removed                            with a cold snare. Resection and retrieval were                            complete.                           Scattered small-mouthed diverticula were found in                            the transverse colon. There  was no evidence of                            diverticular bleeding.                           Multiple small-mouthed diverticula were found in                            the left colon. There was no evidence of                            diverticular bleeding.                           Internal hemorrhoids were found during  retroflexion. The hemorrhoids were small and Grade                            I (internal hemorrhoids that do not prolapse).                           The exam was otherwise without abnormality on                            direct and retroflexion views. Complications:            No immediate complications. Estimated blood loss:                            None. Estimated Blood Loss:     Estimated blood loss: none. Impression:               - One 5 mm polyp in the cecum, removed with a cold                            biopsy forceps. Resected and retrieved.                           - Three 6 to 8 mm polyps in the rectum, in the                            descending colon and at the hepatic flexure,                            removed with a cold snare. Resected and retrieved.                           - Mild diverticulosis in the transverse colon.                            There was no evidence of diverticular bleeding.                           - Moderate diverticulosis in the left colon. There                            was no evidence of diverticular bleeding.                           - Internal hemorrhoids.                           - The examination was otherwise normal on direct                            and retroflexion views. Recommendation:           - Repeat colonoscopy in 3-5 years for surveillance  pending pathology review with a more extensive                            bowel prep.                           - Resume Eliquis (apixaban) tomorrow at prior dose.                            Refer to  managing physician for further adjustment                            of therapy.                           - Patient has a contact number available for                            emergencies. The signs and symptoms of potential                            delayed complications were discussed with the                            patient. Return to normal activities tomorrow.                            Written discharge instructions were provided to the                            patient.                           - High fiber diet.                           - Continue present medications.                           - Await pathology results.                           - No aspirin, ibuprofen, naproxen, or other                            non-steroidal anti-inflammatory drugs for 2 weeks                            after polyp removal. Ladene Artist, MD 03/28/2017 2:34:25 PM This report has been signed electronically.

## 2017-03-31 ENCOUNTER — Telehealth: Payer: Self-pay

## 2017-03-31 NOTE — Telephone Encounter (Signed)
  Follow up Call-  Call back number 03/28/2017  Post procedure Call Back phone  # [phone]  Permission to leave phone message Yes  Some recent data might be hidden     Patient questions:  Do you have a fever, pain , or abdominal swelling? No. Pain Score  0 *  Have you tolerated food without any problems? Yes.    Have you been able to return to your normal activities? Yes.    Do you have any questions about your discharge instructions: Diet   No. Medications  No. Follow up visit  No.  Do you have questions or concerns about your Care? No.  Actions: * If pain score is 4 or above: No action needed, pain <4.  No problems noted per pt. maw

## 2017-04-05 ENCOUNTER — Encounter: Payer: Self-pay | Admitting: Gastroenterology

## 2017-05-04 ENCOUNTER — Other Ambulatory Visit: Payer: Self-pay | Admitting: Cardiovascular Disease

## 2017-05-08 ENCOUNTER — Other Ambulatory Visit: Payer: Self-pay | Admitting: Cardiovascular Disease

## 2017-07-01 ENCOUNTER — Other Ambulatory Visit: Payer: Self-pay | Admitting: Cardiovascular Disease

## 2017-07-01 NOTE — Telephone Encounter (Signed)
valsartan (DIOVAN) 80 MG tablet 90 tablet 3 08/20/2016    Sig - Route: Take 1 tablet (80 mg total) by mouth daily. - Oral   Sent to pharmacy as: valsartan (DIOVAN) 80 MG tablet   Notes to Pharmacy: Patient's previous Valsartan manufactured by [employer] was recalled   E-Prescribing Status: Receipt confirmed by pharmacy (08/20/2016 2:44 PM EDT)   Pharmacy   [id number] - [place], [place] - [address]

## 2017-07-15 ENCOUNTER — Other Ambulatory Visit: Payer: Self-pay | Admitting: Cardiovascular Disease

## 2017-07-27 ENCOUNTER — Other Ambulatory Visit: Payer: Self-pay | Admitting: Cardiovascular Disease

## 2017-09-17 ENCOUNTER — Encounter

## 2017-09-17 ENCOUNTER — Encounter: Payer: Self-pay | Admitting: Physician Assistant

## 2017-09-17 ENCOUNTER — Ambulatory Visit: Payer: BC Managed Care – PPO | Admitting: Physician Assistant

## 2017-09-17 VITALS — BP 126/92 | HR 72 | Ht 71.0 in | Wt 206.0 lb

## 2017-09-17 DIAGNOSIS — I481 Persistent atrial fibrillation: Secondary | ICD-10-CM

## 2017-09-17 DIAGNOSIS — E119 Type 2 diabetes mellitus without complications: Secondary | ICD-10-CM | POA: Diagnosis not present

## 2017-09-17 DIAGNOSIS — I4819 Other persistent atrial fibrillation: Secondary | ICD-10-CM

## 2017-09-17 DIAGNOSIS — I10 Essential (primary) hypertension: Secondary | ICD-10-CM

## 2017-09-17 MED ORDER — VALSARTAN 160 MG PO TABS: 160.0000 mg | ORAL_TABLET | Freq: Every day | ORAL | 3 refills | Status: DC

## 2017-09-17 NOTE — Progress Notes (Signed)
Cardiology Office Note:    Date:  09/17/2017   ID:  Damon Hector, Damon Krueger, DOB September 21, 1950, MRN [id number]  PCP:  Hulan Fess, MD  Cardiologist:  Mertie Moores, MD    Referring MD: Hulan Fess, MD   Chief Complaint  Patient presents with  . Follow-up    AFib, HTN     History of Present Illness:    Damon Hector, Damon Krueger is a 67 y.o. male with persistent atrial fibrillation, diabetes, hypertension, prostate cancer.  He failed cardioversion in 2015 and was placed on flecainide.  He has maintained sinus rhythm on this regimen.  He is on anticoagulation with Apixaban.  He was last seen by Dr. Acie Fredrickson Feb 2019.  Mr. Ralph returns for follow up.  He has noted his blood pressure is higher (140s/80s).  He denies headaches, blurry vision.  He denies chest pain, shortness of breath, syncope, orthopnea, PND or significant pedal edema.   Prior CV studies:   The following studies were reviewed today:  Nuclear stress test 04/07/2013  No ischemia   Echo (02/11/2013):   Mild LVH, EF 50-55%, mild LAE  Past Medical History:  Diagnosis Date  . Abnormal urinalysis    a. Tr Hgb by UA 02/2013 - instructed to f/u PCP.  [name] Atrial fibrillation with rapid ventricular response ([place]) 02/10/2013  . Chronic anticoagulation 12/01/2013  . Diabetes ([place]) 02/10/2013  . Diabetes mellitus   . Diverticulosis   . Essential hypertension   . Gas 03/09/2014  . History of colonic polyps 02/25/2017  . HTN (hypertension) 02/10/2013  . Internal hemorrhoid   . Loose stools 03/09/2014  . PAF (paroxysmal atrial fibrillation) ([place])    a. Dx 02/2013. Placed on Cardizem, Eliquis (CHA2DS2VASc = 2);  b. 02/2013 Echo: EF 50-55%, mildly dil LA, mild LVH.  [name] Prostate cancer ([place])   . Special screening for malignant neoplasms, colon 12/01/2013  . Tubular adenoma of colon    Surgical Hx: The patient  has a past surgical history that includes Prostate surgery (2011); Cardioversion (N/A, 03/18/2013); Colonoscopy; and Polypectomy.   Current  Medications: Current Meds  Medication Sig  . ACCU-CHEK AVIVA PLUS test strip   . acetaminophen (TYLENOL) 325 MG tablet Take 650 mg by mouth every 6 (six) hours as needed for mild pain.  [name] allopurinol (ZYLOPRIM) 300 MG tablet Take 300 mg by mouth daily.   [name] amLODipine (NORVASC) 5 MG tablet Take 1 tablet (5 mg total) by mouth daily.  [name] atorvastatin (LIPITOR) 40 MG tablet Take 40 mg by mouth daily.   . carvedilol (COREG) 12.5 MG tablet Take 1 tablet (12.5 mg total) by mouth 2 (two) times daily.  [name] ELIQUIS 5 MG TABS tablet TAKE 1 TABLET BY MOUTH TWICE DAILY  . flecainide (TAMBOCOR) 50 MG tablet TAKE 1 TABLET TWICE A DAY  . glimepiride (AMARYL) 1 MG tablet Take 1 mg by mouth daily.  . hydrochlorothiazide (HYDRODIURIL) 25 MG tablet TAKE 1 TABLET DAILY  . KLOR-CON M10 10 MEQ tablet TAKE 1 TABLET DAILY  . metFORMIN (GLUCOPHAGE) 1000 MG tablet Take 1,000 mg by mouth 2 (two) times daily.  [name] DELICA LANCETS 26R MISC   . tamsulosin (FLOMAX) 0.4 MG CAPS capsule Take 0.4 mg by mouth daily as needed (for urinary frequency).  . [DISCONTINUED] valsartan (DIOVAN) 80 MG tablet Take 1 tablet (80 mg total) by mouth daily.   Current Facility-Administered Medications for the 09/17/17 encounter (Office Visit) with Richardson Dopp T, PA-C  Medication  . 0.9 %  sodium chloride infusion     Allergies:   Enalapril   Social History   Tobacco Use  . Smoking status: Never Smoker  . Smokeless tobacco: Never Used  Substance Use Topics  . Alcohol use: Yes    Alcohol/week: 0.0 standard drinks    Comment: occasionally  . Drug use: No     Family Hx: The patient's family history includes Diabetes in his sister; Heart attack in his brother; Hypertension in his unknown relative. There is no history of Colon cancer, Esophageal cancer, Rectal cancer, Stomach cancer, or Colon polyps.  ROS:   Please see the history of present illness.    ROS All other systems reviewed and are negative.   EKGs/Labs/Other Test  Reviewed:    EKG:  EKG is not ordered today.    Recent Labs: 01/15/2017: ALT 16; BUN 23; Creatinine, Ser 1.04; Potassium 4.2; Sodium 140   Recent Lipid Panel Lab Results  Component Value Date/Time   CHOL 164 01/15/2017 11:01 AM   TRIG 53 01/15/2017 11:01 AM   HDL 71 01/15/2017 11:01 AM   CHOLHDL 2.3 01/15/2017 11:01 AM   LDLCALC 82 01/15/2017 11:01 AM   From KPN Tool: Cholesterol, total 148.000 05/09/2017 HDL 61.000 05/09/2017 LDL 75.000 05/09/2017 Triglycerides 60.000 05/09/2017 A1C 7.400 05/09/2017 Hemoglobin 15.300 05/09/2017 Creatinine, Serum 1.020 05/09/2017 Potassium 3.900 05/09/2017 Magnesium N/D ALT (SGPT) 14.000 05/09/2017 TSH 1.480 05/09/2017  Physical Exam:    VS:  BP (!) 126/92   Pulse 72   Ht 5\' 11"  (1.803 m)   Wt 206 lb (93.4 kg)   SpO2 94%   BMI 28.73 kg/m     Wt Readings from Last 3 Encounters:  09/17/17 206 lb (93.4 kg)  03/28/17 210 lb (95.3 kg)  03/18/17 210 lb (95.3 kg)     Physical Exam  Constitutional: He is oriented to person, place, and time. He appears well-developed and well-nourished. No distress.  HENT:  Head: Normocephalic and atraumatic.  Eyes: No scleral icterus.  Neck: No JVD present.  Cardiovascular: Normal rate and regular rhythm.  No murmur heard. Pulmonary/Chest: Effort normal. He has no wheezes. He has no rales.  Abdominal: Soft. There is no tenderness.  Musculoskeletal: He exhibits no edema.  Lymphadenopathy:    He has no cervical adenopathy.  Neurological: He is alert and oriented to person, place, and time.  Skin: Skin is warm and dry.  Psychiatric: He has a normal mood and affect.    ASSESSMENT & PLAN:    Persistent atrial fibrillation (HCC) Maintaining normal sinus rhythm.  He is tolerating flecainide and apixaban.  Creatinine and hemoglobin in April 2019 were normal.  Continue current medical regimen.  Essential hypertension Blood pressure has been running above target.  I have recommended that he increase his valsartan to  160 mg daily.  Obtain BMET 2 weeks.  Type 2 diabetes mellitus without complication, without long-term current use of insulin ([place])   Continue follow-up with primary care.   Dispo:  Return in about 6 months (around 03/20/2018) for Routine Follow Up, w/ Dr. Acie Fredrickson, or Richardson Dopp, PA-C.   Medication Adjustments/Labs and Tests Ordered: Current medicines are reviewed at length with the patient today.  Concerns regarding medicines are outlined above.  Tests Ordered: Orders Placed This Encounter  Procedures  . Basic Metabolic Panel (BMET)   Medication Changes: Meds ordered this encounter  Medications  . valsartan (DIOVAN) 160 MG tablet    Sig: Take 1 tablet (160 mg total) by mouth daily.  Dispense:  90 tablet    Refill:  3    DOSE INCREASE    Signed, Richardson Dopp, PA-C  09/17/2017 5:18 PM    [facility] [address], [place], [place]  [address] Phone: [phone]; Fax: [phone]

## 2017-09-17 NOTE — Patient Instructions (Signed)
Medication Instructions:  1. INCREASE VALSARTAN TO 160 MG DAILY; NEW RX HAS BEEN SENT  TO [facility]  Labwork: BMET TO BE DONE IN 2 WEEKS  Testing/Procedures: NONE ORDERED TODAY  Follow-Up: DR. Acie Fredrickson IN 6 MONTHS   Any Other Special Instructions Will Be Listed Below (If Applicable).     If you need a refill on your cardiac medications before your next appointment, please call your pharmacy.

## 2017-10-10 ENCOUNTER — Other Ambulatory Visit: Payer: BC Managed Care – PPO | Admitting: *Deleted

## 2017-10-10 ENCOUNTER — Telehealth: Payer: Self-pay | Admitting: *Deleted

## 2017-10-10 DIAGNOSIS — I10 Essential (primary) hypertension: Secondary | ICD-10-CM

## 2017-10-10 LAB — BASIC METABOLIC PANEL
BUN/Creatinine Ratio: 22 (ref 10–24)
BUN: 28 mg/dL — ABNORMAL HIGH (ref 8–27)
CHLORIDE: 101 mmol/L (ref 96–106)
CO2: 22 mmol/L (ref 20–29)
Calcium: 10.3 mg/dL — ABNORMAL HIGH (ref 8.6–10.2)
Creatinine, Ser: 1.3 mg/dL — ABNORMAL HIGH (ref 0.76–1.27)
GFR, EST AFRICAN AMERICAN: 66 mL/min/{1.73_m2} (ref >59)
GFR, EST NON AFRICAN AMERICAN: 57 mL/min/{1.73_m2} — AB (ref >59)
Glucose: 266 mg/dL — ABNORMAL HIGH (ref 65–99)
POTASSIUM: 4.2 mmol/L (ref 3.5–5.2)
SODIUM: 141 mmol/L (ref 134–144)

## 2017-10-10 MED ORDER — VALSARTAN 80 MG PO TABS: 80.0000 mg | ORAL_TABLET | Freq: Every day | ORAL | 3 refills | Status: DC

## 2017-10-10 NOTE — Telephone Encounter (Signed)
Labs taken to DOD Dr. Meda Coffee. Per DOD recommendation due to kidney function worsen to increase Norvasc to 10 mg and decrease Valsartan back to 80 mg daily, with BMET to be done 7-10 days. Pt has been notified of lab results and recommendations. Pt is agreeable to plan of care to increase Norvasc to 10 mg daily and decrease Valsartan to 80 mg daily; new Rx has been sent in for the Valsartan, Pt states he had enough of the Norvasc 5 mg he can double up. BMET 10/17/17.

## 2017-10-17 ENCOUNTER — Other Ambulatory Visit: Payer: BC Managed Care – PPO | Admitting: *Deleted

## 2017-10-17 DIAGNOSIS — I10 Essential (primary) hypertension: Secondary | ICD-10-CM

## 2017-10-18 LAB — BASIC METABOLIC PANEL
BUN/Creatinine Ratio: 21 (ref 10–24)
BUN: 22 mg/dL (ref 8–27)
CO2: 23 mmol/L (ref 20–29)
CREATININE: 1.07 mg/dL (ref 0.76–1.27)
Calcium: 9.7 mg/dL (ref 8.6–10.2)
Chloride: 98 mmol/L (ref 96–106)
GFR calc Af Amer: 84 mL/min/{1.73_m2} (ref >59)
GFR, EST NON AFRICAN AMERICAN: 72 mL/min/{1.73_m2} (ref >59)
GLUCOSE: 290 mg/dL — AB (ref 65–99)
Potassium: 4.1 mmol/L (ref 3.5–5.2)
Sodium: 138 mmol/L (ref 134–144)

## 2017-10-20 ENCOUNTER — Telehealth: Payer: Self-pay

## 2017-10-20 NOTE — Telephone Encounter (Signed)
-----   Message from Liliane Shi, [place] sent at 10/19/2017  9:55 PM EDT ----- The following abnormalities are noted:  Glucose elevated. Creatinine now is normal.   All other values are normal, stable or within acceptable limits. Medication changes / Follow up labs / Other changes or recommendations:    - Continue current medications and follow up as planned.  Richardson Dopp, PA-C 10/19/2017 9:54 PM

## 2017-10-20 NOTE — Telephone Encounter (Signed)
Notes recorded by Frederik Schmidt, RN on 10/20/2017 at 8:58 AM EDT Informed patient of results/recommendations. He verbalized understanding.

## 2017-10-21 ENCOUNTER — Telehealth: Payer: Self-pay | Admitting: Cardiovascular Disease

## 2017-10-21 MED ORDER — AMLODIPINE BESYLATE 5 MG PO TABS: 10.0000 mg | ORAL_TABLET | Freq: Every day | ORAL | 3 refills | Status: DC

## 2017-10-21 NOTE — Telephone Encounter (Signed)
Spoke with patient who called to ask about the lab results done on 9/13. He requests specific numbers in comparison with labs done on 9/6. He states he is taking valsartan 80 mg and amlodipine 10 mg as directed after lab results on 9/6. I corrected his medication records. He denies concerns and is aware he will receive a letter to schedule a follow-up appointment with Dr. Acie Fredrickson in 5-6 months. He thanked me for my help.

## 2017-10-21 NOTE — Telephone Encounter (Signed)
New Message:    Patient has additional questions about his lab results

## 2017-12-15 ENCOUNTER — Other Ambulatory Visit: Payer: Self-pay | Admitting: Cardiovascular Disease

## 2017-12-15 NOTE — Telephone Encounter (Signed)
Eliquis 5mg  refill request received; pt is 67 yrs old, wt-93.4kg, Crea-1.07 on 10/17/17, last seen by Richardson Dopp on 09/17/17; will send in refill to requested pharmacy.

## 2018-03-02 ENCOUNTER — Other Ambulatory Visit: Payer: Self-pay | Admitting: Cardiovascular Disease

## 2018-03-21 ENCOUNTER — Other Ambulatory Visit: Payer: Self-pay | Admitting: Cardiovascular Disease

## 2018-03-31 ENCOUNTER — Other Ambulatory Visit: Payer: Self-pay | Admitting: Nurse Practitioner

## 2018-03-31 MED ORDER — AMLODIPINE BESYLATE 10 MG PO TABS: 10.0000 mg | ORAL_TABLET | Freq: Every day | ORAL | 0 refills | Status: DC

## 2018-04-30 ENCOUNTER — Telehealth: Payer: Self-pay

## 2018-04-30 NOTE — Telephone Encounter (Signed)
Pt would like to have a virtual visit for his appt.

## 2018-05-01 NOTE — Telephone Encounter (Signed)
Called pt to confirm date and time of virtual visit. Left a message for pt to call back to confirm.

## 2018-05-03 ENCOUNTER — Other Ambulatory Visit: Payer: Self-pay | Admitting: Cardiovascular Disease

## 2018-05-04 ENCOUNTER — Telehealth: Payer: Self-pay | Admitting: Cardiovascular Disease

## 2018-05-04 NOTE — Telephone Encounter (Signed)
Spoke with patient via webex. Confirmed all demographics, e-mail and My Chart activation.

## 2018-05-04 NOTE — Telephone Encounter (Signed)
Pt has consented to virtual visit with Dr. Acie Fredrickson on 05/06/2018 at 10am. Pt has been advised to have BP, HR, and weight ready for visit.

## 2018-05-04 NOTE — Telephone Encounter (Signed)
YOUR CARDIOLOGY TEAM HAS ARRANGED FOR AN E-VISIT FOR YOUR APPOINTMENT - PLEASE REVIEW IMPORTANT INFORMATION BELOW SEVERAL DAYS PRIOR TO YOUR APPOINTMENT  Due to the recent COVID-19 pandemic, we are transitioning in-person office visits to tele-medicine visits in an effort to decrease unnecessary exposure to our patients and staff. Medicare and most insurances are covering these visits without a copay needed. You will need a working email and a smartphone or computer with a camera and microphone. For patients that do not have these items, we can still complete the visit using a telephone but do prefer video when possible. If possible, we also ask that you have a blood pressure cuff and scale at home to measure your blood pressure, heart rate and weight prior to your scheduled appointment. Patients with clinical needs that need an in-person evaluation and testing will still be able to come to the office if absolutely necessary. If you have any questions, feel free to call our office.     DOWNLOADING THE SOFTWARE  Download the [employer] WebEx app to enable video and telephone visits with your [facility] Provider.   Instructions for downloading [employer] WebEx: - Go to https://www.webex.com/downloads.html and follow the instructions, or download the app on your smartphone ([employer] Webex Meetings). - If you have technical difficulties with downloading WebEx, please call WebEx at 1-866-229-3239. - Once the app is downloaded (can be done on either mobile or desktop computer), go to Settings in the upper left hand corner.  Be sure that camera and audio are enabled.  - You will receive an email message with a link to the meeting with a time to join for your tele-health visit.  - Please download the app and have settings configured prior to the appointment time.      2-3 DAYS BEFORE YOUR APPOINTMENT  One of our staff will call you to confirm that you have been able to set up your WebEx account. We will remind  you check your blood pressure, heart rate and weight prior to your scheduled appointment. If you have an Apple Watch or Kardia, please upload any pertinent ECG strips the day before or morning of your appointment to MyChart. Our staff will also make sure you have reviewed the consent and agree to move forward with your scheduled tele-health visit.    THE DAY OF YOUR APPOINTMENT  Approximately 15-20 minutes prior to your scheduled appointment, you will receive an e-mail directly from one of our staff member's [email or web address] e-mail accounts inviting you to join a WebEx meeting.  Please do not reply to that email - simply join the WebEx meeting.  Upon joining, a member of the office staff will speak with you initially through the WebEx platform to confirm medications, vital signs for the day and any symptoms you may be experiencing.  Please have this information available prior to the time of visit start.      CONSENT FOR TELE-HEALTH VISIT - PLEASE RVIEW  I hereby voluntarily request, consent and authorize [facility] and its employed or contracted physicians, physician assistants, nurse practitioners or other licensed health care professionals (the Practitioner), to provide me with telemedicine health care services (the "Services") as deemed necessary by the treating Practitioner. I acknowledge and consent to receive the Services by the Practitioner via telemedicine. I understand that the telemedicine visit will involve communicating with the Practitioner through live audiovisual communication technology and the disclosure of certain medical information by electronic transmission. I acknowledge that I have been given the opportunity to   request an in-person assessment or other available alternative prior to the telemedicine visit and am voluntarily participating in the telemedicine visit.  I understand that I have the right to withhold or withdraw my consent to the use of telemedicine in the course of  my care at any time, without affecting my right to future care or treatment, and that the Practitioner or I may terminate the telemedicine visit at any time. I understand that I have the right to inspect all information obtained and/or recorded in the course of the telemedicine visit and may receive copies of available information for a reasonable fee.  I understand that some of the potential risks of receiving the Services via telemedicine include:  . Delay or interruption in medical evaluation due to technological equipment failure or disruption; . Information transmitted may not be sufficient (e.g. poor resolution of images) to allow for appropriate medical decision making by the Practitioner; and/or  . In rare instances, security protocols could fail, causing a breach of personal health information.  Furthermore, I acknowledge that it is my responsibility to provide information about my medical history, conditions and care that is complete and accurate to the best of my ability. I acknowledge that Practitioner's advice, recommendations, and/or decision may be based on factors not within their control, such as incomplete or inaccurate data provided by me or distortions of diagnostic images or specimens that may result from electronic transmissions. I understand that the practice of medicine is not an exact science and that Practitioner makes no warranties or guarantees regarding treatment outcomes. I acknowledge that I will receive a copy of this consent concurrently upon execution via email to the email address I last provided but may also request a printed copy by calling the office of [facility].    I understand that my insurance will be billed for this visit.   I have read or had this consent read to me. . I understand the contents of this consent, which adequately explains the benefits and risks of the Services being provided via telemedicine.  . I have been provided ample opportunity to ask  questions regarding this consent and the Services and have had my questions answered to my satisfaction. . I give my informed consent for the services to be provided through the use of telemedicine in my medical care  By participating in this telemedicine visit I agree to the above.  

## 2018-05-06 ENCOUNTER — Telehealth (INDEPENDENT_AMBULATORY_CARE_PROVIDER_SITE_OTHER): Payer: BC Managed Care – PPO | Admitting: Cardiovascular Disease

## 2018-05-06 ENCOUNTER — Other Ambulatory Visit: Payer: Self-pay

## 2018-05-06 ENCOUNTER — Encounter: Payer: Self-pay | Admitting: Cardiovascular Disease

## 2018-05-06 VITALS — BP 140/91 | HR 79 | Ht 71.0 in | Wt 196.0 lb

## 2018-05-06 DIAGNOSIS — I4819 Other persistent atrial fibrillation: Secondary | ICD-10-CM

## 2018-05-06 DIAGNOSIS — E782 Mixed hyperlipidemia: Secondary | ICD-10-CM

## 2018-05-06 DIAGNOSIS — I10 Essential (primary) hypertension: Secondary | ICD-10-CM | POA: Diagnosis not present

## 2018-05-06 DIAGNOSIS — I480 Paroxysmal atrial fibrillation: Secondary | ICD-10-CM | POA: Insufficient documentation

## 2018-05-06 DIAGNOSIS — E119 Type 2 diabetes mellitus without complications: Secondary | ICD-10-CM

## 2018-05-06 MED ORDER — POTASSIUM CHLORIDE CRYS ER 10 MEQ PO TBCR: 10.0000 meq | EXTENDED_RELEASE_TABLET | Freq: Every day | ORAL | 3 refills | Status: DC

## 2018-05-06 NOTE — Patient Instructions (Signed)
Medication Instructions:  Your physician recommends that you continue on your current medications as directed. Please refer to the Current Medication list given to you today.  If you need a refill on your cardiac medications before your next appointment, please call your pharmacy.   Lab work: Your physician recommends that you return for lab work on Tuesday May 19. You may come to our office anytime between 7:30 am and 4:45 pm for your lab work.  You will need to FAST for this appointment - nothing to eat or drink after midnight the night before except water or black coffee   I Testing/Procedures: None Ordered    Follow-Up: At [facility], you and your health needs are our priority.  As part of our continuing mission to provide you with exceptional heart care, we have created designated Provider Care Teams.  These Care Teams include your primary Cardiologist (physician) and Advanced Practice Providers (APPs -  Physician Assistants and Nurse Practitioners) who all work together to provide you with the care you need, when you need it. You will need a follow up appointment in:  6 months.  Please call our office 2 months in advance to schedule this appointment.  You may see Mertie Moores, MD or one of the following Advanced Practice Providers on your designated Care Team: Richardson Dopp, PA-C [place], [place] . Daune Perch, NP

## 2018-05-06 NOTE — Progress Notes (Signed)
Virtual Visit via Video Note    Evaluation Performed:  Follow-up visit  This visit type was conducted due to national recommendations for restrictions regarding the COVID-19 Pandemic (e.g. social distancing).  This format is felt to be most appropriate for this patient at this time.  All issues noted in this document were discussed and addressed.  No physical exam was performed (except for noted visual exam findings with Video Visits).  Please refer to the patient's chart (MyChart message for video visits and phone note for telephone visits) for the patient's consent to telehealth for [facility].  Date:  05/06/2018   ID:  Damon Hector, PhD, DOB 1950-03-23, MRN [id number]  Patient Location:  Home   Provider location:    [facility], [place], [place]   PCP:  Hulan Fess, MD  Cardiologist:  Mertie Moores, MD  Electrophysiologist:  None   Chief Complaint:   PAF , HTN , DM   History of Present Illness:    Damon Hector, PhD is a 68 y.o. male who presents via audio/video conferencing for a telehealth visit today.    Seen for his AF and HTN  BP is typically well controlled 115-120  HR is 72-75  Was exercising regularly . Walks 1 hour a day 3-4 days a week.  No CP or dyspnea. Temp was 96.8 this am No cough No chills  Diabetes is under good control Is on eliquis -- no bleeding issues Flecainide 50 bID ,  No symptoms of breakthrough AF    The patient does not have symptoms concerning for COVID-19 infection (fever, chills, cough, or new shortness of breath).    Prior CV studies:   The following studies were reviewed today:    Past Medical History:  Diagnosis Date  . Abnormal urinalysis    a. Tr Hgb by UA 02/2013 - instructed to f/u PCP.  [name] Atrial fibrillation with rapid ventricular response ([place]) 02/10/2013  . Chronic anticoagulation 12/01/2013  . Diabetes ([place]) 02/10/2013  . Diabetes mellitus   . Diverticulosis   . Essential hypertension   . Gas 03/09/2014   . History of colonic polyps 02/25/2017  . HTN (hypertension) 02/10/2013  . Internal hemorrhoid   . Loose stools 03/09/2014  . PAF (paroxysmal atrial fibrillation) ([place])    a. Dx 02/2013. Placed on Cardizem, Eliquis (CHA2DS2VASc = 2);  b. 02/2013 Echo: EF 50-55%, mildly dil LA, mild LVH.  [name] Prostate cancer ([place])   . Special screening for malignant neoplasms, colon 12/01/2013  . Tubular adenoma of colon    Past Surgical History:  Procedure Laterality Date  . CARDIOVERSION N/A 03/18/2013   Procedure: CARDIOVERSION;  Surgeon: Candee Furbish, MD;  [facility] ENDOSCOPY;  Service: Cardiovascular;  Laterality: N/A;  pt shocked x4, starting with 120 joules, 150, 200 and 299 joules without successful conversion...Dr. Marlou Porch will have pt follow up with cardiologists...  . COLONOSCOPY    . POLYPECTOMY    . PROSTATE SURGERY  2011     Current Meds  Medication Sig  . ACCU-CHEK AVIVA PLUS test strip   . acetaminophen (TYLENOL) 325 MG tablet Take 650 mg by mouth every 6 (six) hours as needed for mild pain.  [name] allopurinol (ZYLOPRIM) 300 MG tablet Take 300 mg by mouth daily.   [name] amLODipine (NORVASC) 10 MG tablet Take 1 tablet (10 mg total) by mouth daily.  [name] atorvastatin (LIPITOR) 40 MG tablet Take 40 mg by mouth daily.   . carvedilol (COREG) 12.5 MG tablet TAKE 1  TABLET 2 TIMES DAILY  . ELIQUIS 5 MG TABS tablet TAKE 1 TABLET BY MOUTH TWICE DAILY  . flecainide (TAMBOCOR) 50 MG tablet TAKE 1 TABLET TWICE A DAY  . glimepiride (AMARYL) 1 MG tablet Take 1 mg by mouth daily.  . hydrochlorothiazide (HYDRODIURIL) 25 MG tablet TAKE 1 TABLET DAILY  . metFORMIN (GLUCOPHAGE) 1000 MG tablet Take 1,000 mg by mouth 2 (two) times daily.  [name] DELICA LANCETS 23N MISC   . potassium chloride (KLOR-CON M10) 10 MEQ tablet Take 1 tablet (10 mEq total) by mouth daily.  . tamsulosin (FLOMAX) 0.4 MG CAPS capsule Take 0.4 mg by mouth daily as needed (for urinary frequency).  . valsartan (DIOVAN) 80 MG tablet Take 1 tablet (80  mg total) by mouth daily.  . [DISCONTINUED] potassium chloride (KLOR-CON M10) 10 MEQ tablet Take 1 tablet (10 mEq total) by mouth daily. Pt must keep upcoming virtual visit in April to receive more refills.   Current Facility-Administered Medications for the 05/06/18 encounter (Telemedicine) with Ozelle Brubacher, Wonda Cheng, MD  Medication  . 0.9 %  sodium chloride infusion     Allergies:   Enalapril   Social History   Tobacco Use  . Smoking status: Never Smoker  . Smokeless tobacco: Never Used  Substance Use Topics  . Alcohol use: Yes    Alcohol/week: 0.0 standard drinks    Comment: occasionally  . Drug use: No     Family Hx: The patient's family history includes Diabetes in his sister; Heart attack in his brother; Hypertension in his unknown relative. There is no history of Colon cancer, Esophageal cancer, Rectal cancer, Stomach cancer, or Colon polyps.  ROS:   Please see the history of present illness.     All other systems reviewed and are negative.   Labs/Other Tests and Data Reviewed:    Recent Labs: 10/17/2017: BUN 22; Creatinine, Ser 1.07; Potassium 4.1; Sodium 138   Recent Lipid Panel Lab Results  Component Value Date/Time   CHOL 164 01/15/2017 11:01 AM   TRIG 53 01/15/2017 11:01 AM   HDL 71 01/15/2017 11:01 AM   CHOLHDL 2.3 01/15/2017 11:01 AM   LDLCALC 82 01/15/2017 11:01 AM    Wt Readings from Last 3 Encounters:  05/06/18 196 lb (88.9 kg)  09/17/17 206 lb (93.4 kg)  03/28/17 210 lb (95.3 kg)     Objective:    Vital Signs:  BP (!) 140/91   Pulse 79   Ht 5\' 11"  (1.803 m)   Wt 196 lb (88.9 kg)   BMI 27.34 kg/m    Home BP is normal typically  General:   Appears healthy,   NAD HEENT:   No obvious JVD or lymphadenopathy Resp:   Normal work of breathing,   resp rate is normal  CV :   BP and HR are normal ,  No edema Abd:   No abdomina swelling , Ext:   No clubbing, cyanosis, or edema  Neuro:   Alert and oriented x 3.   No obvious motor deficits Skin : no  obvious rashes    ASSESSMENT & PLAN:    1.  Paroxysmal atrial fibrillation: The patient is on flecainide 50 mg twice a day.  He also is on Eliquis.  He is not having much/any breakthrough atrial fibrillation.  Continue current medications  2.  Essential hypertension: His blood pressures typically well controlled.  It is a little elevated this morning but that seems to be a fluid.  He takes his blood  pressure daily and his levels appear to be well controlled.  He is on HCTZ and potassium.  He will need to get a potassium level sometime soon.  We will go ahead and refill his medications.  I think that it would be fine to get a potassium level in mid May and I do not necessarily think that he needs to come in now given the concerns of the COVID-19 infections.  2.  Diabetes mellitus: He will check in with Dr. Rex Kras.  He needs to get a hemoglobin A1c.  Continue current medications.  COVID-19 Education: The signs and symptoms of COVID-19 were discussed with the patient and how to seek care for testing (follow up with PCP or arrange E-visit).  The importance of social distancing was discussed today.  Patient Risk:   After full review of this patient's clinical status, I feel that they are at least moderate risk at this time.  Time:   Today, I have spent 22  minutes with the patient with telehealth technology discussing  Meds, BP , exercise .     Medication Adjustments/Labs and Tests Ordered: Current medicines are reviewed at length with the patient today.  Concerns regarding medicines are outlined above.  Tests Ordered: Orders Placed This Encounter  Procedures  . Lipid Profile  . Hepatic function panel  . Basic Metabolic Panel (BMET)  . HgB A1c  . CBC   Medication Changes: Meds ordered this encounter  Medications  . potassium chloride (KLOR-CON M10) 10 MEQ tablet    Sig: Take 1 tablet (10 mEq total) by mouth daily.    Dispense:  90 tablet    Refill:  3    Disposition:  Follow up in  6 month(s)  Signed, Mertie Moores, MD  05/06/2018 10:31 AM    [facility]

## 2018-05-15 ENCOUNTER — Ambulatory Visit (INDEPENDENT_AMBULATORY_CARE_PROVIDER_SITE_OTHER): Payer: BC Managed Care – PPO

## 2018-05-15 ENCOUNTER — Other Ambulatory Visit: Payer: Self-pay

## 2018-05-15 ENCOUNTER — Ambulatory Visit (HOSPITAL_COMMUNITY)
Admission: EM | Admit: 2018-05-15 | Discharge: 2018-05-15 | Disposition: A | Discharge status: Home / Self Care | Payer: BC Managed Care – PPO | Attending: Physician Assistant | Admitting: Physician Assistant

## 2018-05-15 ENCOUNTER — Encounter (HOSPITAL_COMMUNITY): Payer: Self-pay | Admitting: Emergency Medicine

## 2018-05-15 DIAGNOSIS — W19XXXA Unspecified fall, initial encounter: Secondary | ICD-10-CM

## 2018-05-15 DIAGNOSIS — S2241XA Multiple fractures of ribs, right side, initial encounter for closed fracture: Secondary | ICD-10-CM

## 2018-05-15 DIAGNOSIS — R0781 Pleurodynia: Secondary | ICD-10-CM

## 2018-05-15 DIAGNOSIS — I10 Essential (primary) hypertension: Secondary | ICD-10-CM

## 2018-05-15 DIAGNOSIS — Z8249 Family history of ischemic heart disease and other diseases of the circulatory system: Secondary | ICD-10-CM | POA: Diagnosis not present

## 2018-05-15 NOTE — ED Triage Notes (Signed)
Pt states today he tripped on a branch and landed on his R rib area on a tree log, states he hit his mouth too. Pt also has an abrasion on his R forearm

## 2018-05-15 NOTE — ED Provider Notes (Signed)
[place]    CSN: [id number] Arrival date & time: 05/15/18  1903     History   Chief Complaint No chief complaint on file.   HPI Damon Hector, PhD is a 68 y.o. male.   The history is provided by the patient. No language interpreter was used.  Chest Pain  Pain location:  R chest Pain quality: aching   Pain severity:  Moderate Onset quality:  Gradual Timing:  Constant Progression:  Worsening Chronicity:  New Relieved by:  Nothing Worsened by:  Nothing Ineffective treatments:  None tried Associated symptoms: no cough   Pt complains of pain in right ribs after falling over a branch.   Past Medical History:  Diagnosis Date  . Abnormal urinalysis    a. Tr Hgb by UA 02/2013 - instructed to f/u PCP.  [name] Atrial fibrillation with rapid ventricular response ([place]) 02/10/2013  . Chronic anticoagulation 12/01/2013  . Diabetes ([place]) 02/10/2013  . Diabetes mellitus   . Diverticulosis   . Essential hypertension   . Gas 03/09/2014  . History of colonic polyps 02/25/2017  . HTN (hypertension) 02/10/2013  . Internal hemorrhoid   . Loose stools 03/09/2014  . PAF (paroxysmal atrial fibrillation) ([place])    a. Dx 02/2013. Placed on Cardizem, Eliquis (CHA2DS2VASc = 2);  b. 02/2013 Echo: EF 50-55%, mildly dil LA, mild LVH.  [name] Prostate cancer ([place])   . Special screening for malignant neoplasms, colon 12/01/2013  . Tubular adenoma of colon     Patient Active Problem List   Diagnosis Date Noted  . Paroxysmal atrial fibrillation ([place]) 05/06/2018  . History of colonic polyps 02/25/2017  . Essential hypertension   . Persistent atrial fibrillation ([place])   . Loose stools 03/09/2014  . Gas 03/09/2014  . Special screening for malignant neoplasms, colon 12/01/2013  . Chronic anticoagulation 12/01/2013  . Viral URI 02/11/2013  . Abnormal urinalysis   . Diabetes ([place]) 02/10/2013    Past Surgical History:  Procedure Laterality Date  . CARDIOVERSION N/A 03/18/2013   Procedure:  CARDIOVERSION;  Surgeon: Candee Furbish, MD;  Location[facility] ENDOSCOPY;  Service: Cardiovascular;  Laterality: N/A;  pt shocked x4, starting with 120 joules, 150, 200 and 299 joules without successful conversion...Dr. Marlou Porch will have pt follow up with cardiologists...  . COLONOSCOPY    . POLYPECTOMY    . PROSTATE SURGERY  2011       Home Medications    Prior to Admission medications   Medication Sig Start Date End Date Taking? Authorizing Provider  ACCU-CHEK AVIVA PLUS test strip  10/09/13   [provider]  acetaminophen (TYLENOL) 325 MG tablet Take 650 mg by mouth every 6 (six) hours as needed for mild pain.    [provider]  allopurinol (ZYLOPRIM) 300 MG tablet Take 300 mg by mouth daily.  08/01/13   [provider]  amLODipine (NORVASC) 10 MG tablet Take 1 tablet (10 mg total) by mouth daily. 03/31/18   Nahser, Wonda Cheng, MD  atorvastatin (LIPITOR) 40 MG tablet Take 40 mg by mouth daily.  02/15/13   [provider]  carvedilol (COREG) 12.5 MG tablet TAKE 1 TABLET 2 TIMES DAILY 03/04/18   Nahser, Wonda Cheng, MD  ELIQUIS 5 MG TABS tablet TAKE 1 TABLET BY MOUTH TWICE DAILY 12/15/17   Nahser, Wonda Cheng, MD  flecainide (TAMBOCOR) 50 MG tablet TAKE 1 TABLET TWICE A DAY 03/04/18   Nahser, Wonda Cheng, MD  glimepiride (AMARYL) 1 MG tablet Take 1 mg by  mouth daily. 12/27/14   [provider]  hydrochlorothiazide (HYDRODIURIL) 25 MG tablet TAKE 1 TABLET DAILY 03/04/18   Nahser, Wonda Cheng, MD  metFORMIN (GLUCOPHAGE) 1000 MG tablet Take 1,000 mg by mouth 2 (two) times daily. 04/25/14   [provider]  ONETOUCH DELICA LANCETS 63K MISC  05/20/13   [provider]  potassium chloride (KLOR-CON M10) 10 MEQ tablet Take 1 tablet (10 mEq total) by mouth daily. 05/06/18   Nahser, Wonda Cheng, MD  tamsulosin (FLOMAX) 0.4 MG CAPS capsule Take 0.4 mg by mouth daily as needed (for urinary frequency).    [provider]  valsartan (DIOVAN) 80 MG tablet Take 1  tablet (80 mg total) by mouth daily. 10/10/17   Liliane Shi, PA-C    Family History Family History  Problem Relation Age of Onset  . Diabetes Sister   . Heart attack Brother   . Hypertension Other   . Colon cancer Neg Hx   . Esophageal cancer Neg Hx   . Rectal cancer Neg Hx   . Stomach cancer Neg Hx   . Colon polyps Neg Hx     Social History Social History   Tobacco Use  . Smoking status: Never Smoker  . Smokeless tobacco: Never Used  Substance Use Topics  . Alcohol use: Yes    Alcohol/week: 0.0 standard drinks    Comment: occasionally  . Drug use: No     Allergies   Enalapril   Review of Systems Review of Systems  Respiratory: Negative for cough.   Cardiovascular: Positive for chest pain.  All other systems reviewed and are negative.    Physical Exam Triage Vital Signs ED Triage Vitals [05/15/18 1912]  Enc Vitals Group     BP (!) 161/101     Pulse Rate 78     Resp 16     Temp (!) 97.5 F (36.4 C)     Temp src      SpO2 94 %     Weight      Height      Head Circumference      Peak Flow      Pain Score      Pain Loc      Pain Edu?      Excl. in [place]?    No data found.  Updated Vital Signs BP (!) 161/101   Pulse 78   Temp (!) 97.5 F (36.4 C)   Resp 16   SpO2 94%   Visual Acuity Right Eye Distance:   Left Eye Distance:   Bilateral Distance:    Right Eye Near:   Left Eye Near:    Bilateral Near:     Physical Exam Vitals signs and nursing note reviewed.  Constitutional:      Appearance: He is well-developed.  HENT:     Head: Normocephalic.     Nose: Nose normal.  Neck:     Musculoskeletal: Normal range of motion.  Cardiovascular:     Rate and Rhythm: Normal rate.     Comments: Tender right upper lateral ribs  Pulmonary:     Effort: Pulmonary effort is normal.     Breath sounds: Normal breath sounds.  Abdominal:     General: There is no distension.  Musculoskeletal: Normal range of motion.  Skin:    General: Skin is warm.   Neurological:     Mental Status: He is alert and oriented to person, place, and time.      UC  Treatments / Results  Labs (all labs ordered are listed, but only abnormal results are displayed) Labs Reviewed - No data to display  EKG None  Radiology No results found.  Procedures Procedures (including critical care time)  Medications Ordered in UC Medications - No data to display  Initial Impression / Assessment and Plan / UC Course  I have reviewed the triage vital signs and the nursing notes.  Pertinent labs & imaging results that were available during my care of the patient were reviewed by me and considered in my medical decision making (see chart for details).      Final Clinical Impressions(s) / UC Diagnoses   Final diagnoses:  Fall  Closed fracture of multiple ribs of right side, initial encounter     Discharge Instructions     Your exam is suspicious for rib fracture.  Tylenol for pain    ED Prescriptions    None     Controlled Substance Prescriptions [place] Controlled Substance Registry consulted? Not Applicable  An After Visit Summary was printed and given to the patient.    Fransico Meadow, [place] 05/15/18 2005

## 2018-05-15 NOTE — Discharge Instructions (Signed)
Your exam is suspicious for rib fracture.  Tylenol for pain

## 2018-06-18 ENCOUNTER — Other Ambulatory Visit: Payer: Self-pay | Admitting: Cardiovascular Disease

## 2018-06-23 ENCOUNTER — Other Ambulatory Visit: Payer: BC Managed Care – PPO

## 2018-06-23 ENCOUNTER — Other Ambulatory Visit: Payer: Self-pay

## 2018-06-23 DIAGNOSIS — I4819 Other persistent atrial fibrillation: Secondary | ICD-10-CM

## 2018-06-23 DIAGNOSIS — E119 Type 2 diabetes mellitus without complications: Secondary | ICD-10-CM

## 2018-06-23 DIAGNOSIS — E782 Mixed hyperlipidemia: Secondary | ICD-10-CM

## 2018-06-23 DIAGNOSIS — I10 Essential (primary) hypertension: Secondary | ICD-10-CM

## 2018-06-24 LAB — CBC
Hematocrit: 44.4 % (ref 37.5–51.0)
Hemoglobin: 15.2 g/dL (ref 13.0–17.7)
MCH: 32.1 pg (ref 26.6–33.0)
MCHC: 34.2 g/dL (ref 31.5–35.7)
MCV: 94 fL (ref 79–97)
Platelets: 151 10*3/uL (ref 150–450)
RBC: 4.73 x10E6/uL (ref 4.14–5.80)
RDW: 12 % (ref 11.6–15.4)
WBC: 6.7 10*3/uL (ref 3.4–10.8)

## 2018-06-24 LAB — HEMOGLOBIN A1C
Est. average glucose Bld gHb Est-mCnc: 157 mg/dL
Hgb A1c MFr Bld: 7.1 % — ABNORMAL HIGH (ref 4.8–5.6)

## 2018-06-24 LAB — HEPATIC FUNCTION PANEL
ALT: 16 IU/L (ref 0–44)
AST: 16 IU/L (ref 0–40)
Albumin: 4.3 g/dL (ref 3.8–4.8)
Alkaline Phosphatase: 86 IU/L (ref 39–117)
Bilirubin Total: 0.6 mg/dL (ref 0.0–1.2)
Bilirubin, Direct: 0.2 mg/dL (ref 0.00–0.40)
Total Protein: 6.6 g/dL (ref 6.0–8.5)

## 2018-06-24 LAB — BASIC METABOLIC PANEL
BUN/Creatinine Ratio: 23 (ref 10–24)
BUN: 23 mg/dL (ref 8–27)
CO2: 26 mmol/L (ref 20–29)
Calcium: 9.7 mg/dL (ref 8.6–10.2)
Chloride: 101 mmol/L (ref 96–106)
Creatinine, Ser: 1.02 mg/dL (ref 0.76–1.27)
GFR calc Af Amer: 89 mL/min/{1.73_m2} (ref >59)
GFR calc non Af Amer: 77 mL/min/{1.73_m2} (ref >59)
Glucose: 104 mg/dL — ABNORMAL HIGH (ref 65–99)
Potassium: 4.4 mmol/L (ref 3.5–5.2)
Sodium: 139 mmol/L (ref 134–144)

## 2018-06-24 LAB — LIPID PANEL
Chol/HDL Ratio: 2.4 ratio (ref 0.0–5.0)
Cholesterol, Total: 156 mg/dL (ref 100–199)
HDL: 66 mg/dL (ref >39)
LDL Calculated: 78 mg/dL (ref 0–99)
Triglycerides: 58 mg/dL (ref 0–149)
VLDL Cholesterol Cal: 12 mg/dL (ref 5–40)

## 2018-10-01 ENCOUNTER — Other Ambulatory Visit: Payer: Self-pay | Admitting: Physician Assistant

## 2018-10-28 ENCOUNTER — Other Ambulatory Visit: Payer: Self-pay | Admitting: Cardiovascular Disease

## 2018-10-28 NOTE — Telephone Encounter (Signed)
Prescription refill request for Eliquis received.  Last office visit: Nahser (05-06-2018) Scr: 1.02 ( 06-23-2018) Age: 68 y.o. Weight: 88.9 kg   Prescription refill sent.

## 2018-11-01 NOTE — Progress Notes (Signed)
.   Cardiology Office Note   Date:  11/02/2018   ID:  Damon Krueger, Damon Krueger, DOB [birth year], MRN [id number]  PCP:  Damon Fess, MD  Cardiologist:   Damon Moores, MD   Chief Complaint  Patient presents with  . Atrial Fibrillation   Problem list: 1. Essential hypertension 2. Diabetes mellitus 3. Prostate cancer 4. Paroxysmal Atrial fibrillation  Damon Krueger is a 68 y.o.  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 He was recently admitted 1/7-1/8 with AFib with RVR in the setting of URI (Flu negative). He was placed on CCB and Eliquis (CHADS2-VASc=2). CEs were negative. Echo (02/11/2013): Mild LVH, EF 50-55%, mild LAE. Plan was to proceed with DCCV if he did not convert to NSR on his own (? If viral illness resulted in AFib).   Feb. 13, 2015: He underwent cardioversion but unfortunately, he did not convert to sinus rhythm. His Cardizem was increased to 360 mg a day. He feels well. He's completely asymptomatic. He cannot tell that his heart rate is beating irregularly or not.  April 29, 2013:  The patient has failed cardioversion back in February ( as noted above) . We started him on flecainide and he converted to normal sinus rhythm. A stress Myoview study was negative for ischemia. He did not have any evidence of pro-arrhythmia. He's feeling quite well. He's walking about 20 miles a week.  July 14, 2013:  Damon Krueger is doing well. Still walking 20 miles a week. He's not had any palpitations. He denies any chest pain or shortness of breath  Sept. 24, 2015:  [name] is doing . No arrhythmias. No CP. No dyspnea. Exercising regularly He measures his blood pressure at home on a regular basis. His typical readings were in the 110/73 range    April 08, 2014:   Damon Krueger, Damon Krueger is a 68 y.o. male who presents for follow-up of his atrial fibrillation and hypertension. BP is typically much better at home.  BP is a bit high tonight .   He  complains of having "floaters" in his visual field.    [name]/ 10, 2017"  He developed an allergic reaction to the Enalapril.   It was stopped.  BP runs high on occasion Is back exercising - walks / jogs 25 miles a week .    August 16, 2015:  Doing well .   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  April 05, 2016:  Feeling ok BP has been elevated.  Actually very variable Does not eat salt.  Goes out to eat once a week - chooses a low salt option  Went to the ER 2 weeks ago Amlodipine was increased to 7.5 mg a day   July 09, 2016:  Has been doing well  We added Valsartan  Exercising some   March 18, 2017:  As a as seen today for for follow-up of his paroxysmal atrial fibrillation and hypertension. Notes occasional palpitations - last for several minutes.  No associated dizziness or sweats or  CP or dyspnea  Occurs when he is under stress Exercising regularly .    Sept. 28. 2020  Damon Krueger is seen for follow up of his PAF, HTN We had a virtual visit this past spring . Exercising regulalry  Watches his salt intake   Past Medical History:  Diagnosis Date  . Abnormal urinalysis    a. Tr Hgb  by UA 02/2013 - instructed to f/u PCP.  [name] Atrial fibrillation with rapid ventricular response ([place]) 02/10/2013  . Chronic anticoagulation 12/01/2013  . Diabetes ([place]) 02/10/2013  . Diabetes mellitus   . Diverticulosis   . Essential hypertension   . Gas 03/09/2014  . History of colonic polyps 02/25/2017  . HTN (hypertension) 02/10/2013  . Internal hemorrhoid   . Loose stools 03/09/2014  . PAF (paroxysmal atrial fibrillation) ([place])    a. Dx 02/2013. Placed on Cardizem, Eliquis (CHA2DS2VASc = 2);  b. 02/2013 Echo: EF 50-55%, mildly dil LA, mild LVH.  [name] Prostate cancer ([place])   . Special screening for malignant neoplasms, colon 12/01/2013  . Tubular adenoma of colon     Past Surgical History:  Procedure Laterality Date  . CARDIOVERSION N/A 03/18/2013    Procedure: CARDIOVERSION;  Surgeon: Candee Furbish, MD;  Location: [facility] ENDOSCOPY;  Service: Cardiovascular;  Laterality: N/A;  pt shocked x4, starting with 120 joules, 150, 200 and 299 joules without successful conversion...Dr. Marlou Porch will have pt follow up with cardiologists...  . COLONOSCOPY    . POLYPECTOMY    . PROSTATE SURGERY  2011     Current Outpatient Medications  Medication Sig Dispense Refill  . ACCU-CHEK AVIVA PLUS test strip     . acetaminophen (TYLENOL) 325 MG tablet Take 650 mg by mouth every 6 (six) hours as needed for mild pain.    [name] allopurinol (ZYLOPRIM) 300 MG tablet Take 300 mg by mouth daily.     [name] amLODipine (NORVASC) 10 MG tablet TAKE 1 TABLET DAILY 90 tablet 1  . atorvastatin (LIPITOR) 40 MG tablet Take 40 mg by mouth daily.     . carvedilol (COREG) 12.5 MG tablet TAKE 1 TABLET 2 TIMES DAILY 180 tablet 2  . ELIQUIS 5 MG TABS tablet TAKE 1 TABLET BY MOUTH TWICE DAILY 180 tablet 1  . flecainide (TAMBOCOR) 50 MG tablet TAKE 1 TABLET TWICE A DAY 180 tablet 2  . glimepiride (AMARYL) 2 MG tablet Take 1 tablet by mouth daily.    . hydrochlorothiazide (HYDRODIURIL) 25 MG tablet TAKE 1 TABLET DAILY 90 tablet 2  . metFORMIN (GLUCOPHAGE) 1000 MG tablet Take 1,000 mg by mouth 2 (two) times daily.    [name] DELICA LANCETS [id number] MISC     . potassium chloride (KLOR-CON M10) 10 MEQ tablet Take 1 tablet (10 mEq total) by mouth daily. 90 tablet 3  . tamsulosin (FLOMAX) 0.4 MG CAPS capsule Take 0.4 mg by mouth daily as needed (for urinary frequency).    . valsartan (DIOVAN) 80 MG tablet TAKE 1 TABLET BY MOUTH EVERY DAY 90 tablet 2   Current Facility-Administered Medications  Medication Dose Route Frequency Provider Last Rate Last Dose  . 0.9 %  sodium chloride infusion  500 mL Intravenous Once Ladene Artist, MD        Allergies:   Enalapril    Social History:  The patient  reports that he has never smoked. He has never used smokeless tobacco. He reports current alcohol use.  He reports that he does not use drugs.   Family History:  The patient's family history includes Diabetes in his sister; Heart attack in his brother; Hypertension in an other family member.    ROS:  As per current hx, otherwise all systems are negative   Physical Exam: Blood pressure 124/86, pulse 75, height 5\' 11"  (1.803 m), weight 199 lb 12.8 oz (90.6 kg), SpO2 97 %.  GEN:  Well nourished, well developed  in no acute distress HEENT: Normal NECK: No JVD; No carotid bruits LYMPHATICS: No lymphadenopathy CARDIAC: RRR , no murmurs, rubs, gallops RESPIRATORY:  Clear to auscultation without rales, wheezing or rhonchi  ABDOMEN: Soft, non-tender, non-distended MUSCULOSKELETAL:  No edema; No deformity  SKIN: Warm and dry NEUROLOGIC:  Alert and oriented x 3  EKG:  Sept. 28, 2020 :   NSR at 9 with PACs.  INc. RBBB    Recent Labs: 06/23/2018: ALT 16; BUN 23; Creatinine, Ser 1.02; Hemoglobin 15.2; Platelets 151; Potassium 4.4; Sodium 139    Lipid Panel    Component Value Date/Time   CHOL 156 06/23/2018 0901   TRIG 58 06/23/2018 0901   HDL 66 06/23/2018 0901   CHOLHDL 2.4 06/23/2018 0901   LDLCALC 78 06/23/2018 0901      Wt Readings from Last 3 Encounters:  11/02/18 199 lb 12.8 oz (90.6 kg)  05/06/18 196 lb (88.9 kg)  09/17/17 206 lb (93.4 kg)      Other studies Reviewed: Additional studies/ records that were reviewed today include: . Review of the above records demonstrates:    ASSESSMENT AND PLAN:  1.  Paroxysmal atrial fibrillation.  His is staying in normal sinus rhythm.  Continue current dose of flecainide.  His labs from May look good.    2. Essential hypertension:    Continue current medications.  Blood pressure looks stable.  We will check a basic metabolic profile when I see him again in 1 year.  3.  Hyperlipidemia: Lipid levels look great.  Continue current dose of atorvastatin.  Will check lipids and liver enzymes when I see him again in 1 year..   Current  medicines are reviewed at length with the patient today.  The patient does not have concerns regarding medicines.  Signed, Damon Moores, MD  11/02/2018 10:48 AM    [facility] [place], [place], [place]  [address] Phone: [phone]; Fax: [phone]

## 2018-11-02 ENCOUNTER — Encounter: Payer: Self-pay | Admitting: Cardiovascular Disease

## 2018-11-02 ENCOUNTER — Ambulatory Visit: Payer: BC Managed Care – PPO | Admitting: Cardiovascular Disease

## 2018-11-02 ENCOUNTER — Other Ambulatory Visit: Payer: Self-pay

## 2018-11-02 VITALS — BP 124/86 | HR 75 | Ht 71.0 in | Wt 199.8 lb

## 2018-11-02 DIAGNOSIS — E782 Mixed hyperlipidemia: Secondary | ICD-10-CM | POA: Diagnosis not present

## 2018-11-02 DIAGNOSIS — I4819 Other persistent atrial fibrillation: Secondary | ICD-10-CM

## 2018-11-02 DIAGNOSIS — E119 Type 2 diabetes mellitus without complications: Secondary | ICD-10-CM

## 2018-11-02 DIAGNOSIS — I10 Essential (primary) hypertension: Secondary | ICD-10-CM

## 2018-11-02 NOTE — Patient Instructions (Signed)
Medication Instructions:  Your physician recommends that you continue on your current medications as directed. Please refer to the Current Medication list given to you today.  If you need a refill on your cardiac medications before your next appointment, please call your pharmacy.    Lab work: Your physician recommends that you return for lab work in: 12 months on the day of or a few days before your office visit with Dr. Nahser.  You will need to FAST for this appointment - nothing to eat or drink after midnight the night before except water.    Testing/Procedures: None Ordered   Follow-Up: At [facility], you and your health needs are our priority.  As part of our continuing mission to provide you with exceptional heart care, we have created designated Provider Care Teams.  These Care Teams include your primary Cardiologist (physician) and Advanced Practice Providers (APPs -  Physician Assistants and Nurse Practitioners) who all work together to provide you with the care you need, when you need it. You will need a follow up appointment in:  1 years.  Please call our office 2 months in advance to schedule this appointment.  You may see Philip Nahser, MD or one of the following Advanced Practice Providers on your designated Care Team: Scott Weaver, PA-C Vin Bhagat, PA-C . Janine Hammond, NP    

## 2018-12-13 ENCOUNTER — Other Ambulatory Visit: Payer: Self-pay | Admitting: Cardiovascular Disease

## 2018-12-27 ENCOUNTER — Other Ambulatory Visit: Payer: Self-pay | Admitting: Cardiovascular Disease

## 2019-01-14 ENCOUNTER — Other Ambulatory Visit: Payer: Self-pay | Admitting: Cardiovascular Disease

## 2019-02-23 ENCOUNTER — Telehealth: Payer: Self-pay | Admitting: Cardiovascular Disease

## 2019-02-23 NOTE — Telephone Encounter (Signed)
We are recommending the COVID-19 vaccine to all of our patients. Cardiac medications (including blood thinners) should not deter anyone from being vaccinated and there is no need to hold any of those medications prior to vaccine administration.     Currently, there is a hotline to call (active 02/12/19) to schedule vaccination appointments as no walk-ins will be accepted.   Number: [phone].    If an appointment is not available please go to [email or web address] to sign up for notification when additional vaccine appointments are available.   If you have further questions or concerns about the vaccine process, please visit [email or web address] or contact your primary care physician.   I have informed patient of instructions.   

## 2019-04-26 ENCOUNTER — Other Ambulatory Visit: Payer: Self-pay | Admitting: *Deleted

## 2019-04-26 MED ORDER — APIXABAN 5 MG PO TABS: 5.0000 mg | ORAL_TABLET | Freq: Two times a day (BID) | ORAL | 0 refills | Status: DC

## 2019-04-26 NOTE — Telephone Encounter (Signed)
Eliquis 5mg  refill request received from [facility]. Pt is 69 years old, weight-90.6kg, Crea-1.02 on 06/23/2018, Diagnosis-Afib, and last seen by Dr. Acie Fredrickson on 11/02/2018. Dose is appropriate based on dosing criteria. Will send in refill to requested pharmacy.

## 2019-05-16 ENCOUNTER — Other Ambulatory Visit: Payer: Self-pay | Admitting: Cardiovascular Disease

## 2019-05-18 MED ORDER — POTASSIUM CHLORIDE CRYS ER 10 MEQ PO TBCR: 10.0000 meq | EXTENDED_RELEASE_TABLET | Freq: Every day | ORAL | 1 refills | Status: DC

## 2019-06-11 ENCOUNTER — Other Ambulatory Visit: Payer: Self-pay | Admitting: Cardiovascular Disease

## 2019-06-25 ENCOUNTER — Other Ambulatory Visit: Payer: Self-pay | Admitting: Physician Assistant

## 2019-07-26 ENCOUNTER — Other Ambulatory Visit: Payer: Self-pay | Admitting: *Deleted

## 2019-07-26 MED ORDER — APIXABAN 5 MG PO TABS: 5.0000 mg | ORAL_TABLET | Freq: Two times a day (BID) | ORAL | 1 refills | Status: DC

## 2019-07-26 NOTE — Telephone Encounter (Signed)
Prescription refill request for Eliquis received.  Last office visit: Nahser, 11/02/2018 Scr: 1.3 06/03/2019 Age: 69 yo Weight: 90.6 kg   Prescription refill sent.

## 2019-09-02 ENCOUNTER — Other Ambulatory Visit: Payer: Self-pay | Admitting: Cardiovascular Disease

## 2019-10-24 ENCOUNTER — Other Ambulatory Visit: Payer: Self-pay | Admitting: Cardiovascular Disease

## 2019-11-05 ENCOUNTER — Other Ambulatory Visit: Payer: BC Managed Care – PPO | Admitting: *Deleted

## 2019-11-05 ENCOUNTER — Other Ambulatory Visit: Payer: Self-pay

## 2019-11-05 DIAGNOSIS — E119 Type 2 diabetes mellitus without complications: Secondary | ICD-10-CM

## 2019-11-05 DIAGNOSIS — I4819 Other persistent atrial fibrillation: Secondary | ICD-10-CM

## 2019-11-05 DIAGNOSIS — E782 Mixed hyperlipidemia: Secondary | ICD-10-CM

## 2019-11-05 DIAGNOSIS — I10 Essential (primary) hypertension: Secondary | ICD-10-CM

## 2019-11-05 LAB — BASIC METABOLIC PANEL
BUN/Creatinine Ratio: 19 (ref 10–24)
BUN: 23 mg/dL (ref 8–27)
CO2: 23 mmol/L (ref 20–29)
Calcium: 9.5 mg/dL (ref 8.6–10.2)
Chloride: 100 mmol/L (ref 96–106)
Creatinine, Ser: 1.19 mg/dL (ref 0.76–1.27)
GFR calc Af Amer: 73 mL/min/{1.73_m2} (ref >59)
GFR calc non Af Amer: 63 mL/min/{1.73_m2} (ref >59)
Glucose: 149 mg/dL — ABNORMAL HIGH (ref 65–99)
Potassium: 4.1 mmol/L (ref 3.5–5.2)
Sodium: 137 mmol/L (ref 134–144)

## 2019-11-05 LAB — HEPATIC FUNCTION PANEL
ALT: 16 IU/L (ref 0–44)
AST: 17 IU/L (ref 0–40)
Albumin: 4.2 g/dL (ref 3.8–4.8)
Alkaline Phosphatase: 63 IU/L (ref 44–121)
Bilirubin Total: 0.4 mg/dL (ref 0.0–1.2)
Bilirubin, Direct: 0.16 mg/dL (ref 0.00–0.40)
Total Protein: 6.8 g/dL (ref 6.0–8.5)

## 2019-11-05 LAB — LIPID PANEL
Chol/HDL Ratio: 2.4 ratio (ref 0.0–5.0)
Cholesterol, Total: 160 mg/dL (ref 100–199)
HDL: 66 mg/dL (ref >39)
LDL Chol Calc (NIH): 82 mg/dL (ref 0–99)
Triglycerides: 61 mg/dL (ref 0–149)
VLDL Cholesterol Cal: 12 mg/dL (ref 5–40)

## 2019-11-07 ENCOUNTER — Encounter: Payer: Self-pay | Admitting: Cardiovascular Disease

## 2019-11-07 NOTE — Progress Notes (Signed)
.   Cardiology Office Note   Date:  11/08/2019   ID:  Damon Hector, Damon Krueger, DOB 1950-05-12, MRN [id number]  PCP:  Hulan Fess, MD  Cardiologist:   Mertie Moores, MD   Chief Complaint  Patient presents with  . Hypertension  . Atrial Fibrillation   Problem list: 1. Essential hypertension 2. Diabetes mellitus 3. Prostate cancer 4. Paroxysmal Atrial fibrillation  Damon Krueger is a 69 y.o.  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 He was recently admitted 1/7-1/8 with AFib with RVR in the setting of URI (Flu negative). He was placed on CCB and Eliquis (CHADS2-VASc=2). CEs were negative. Echo (02/11/2013): Mild LVH, EF 50-55%, mild LAE. Plan was to proceed with DCCV if he did not convert to NSR on his own (? If viral illness resulted in AFib).   Feb. 13, 2015: He underwent cardioversion but unfortunately, he did not convert to sinus rhythm. His Cardizem was increased to 360 mg a day. He feels well. He's completely asymptomatic. He cannot tell that his heart rate is beating irregularly or not.  April 29, 2013:  The patient has failed cardioversion back in February ( as noted above) . We started him on flecainide and he converted to normal sinus rhythm. A stress Myoview study was negative for ischemia. He did not have any evidence of pro-arrhythmia. He's feeling quite well. He's walking about 20 miles a week.  July 14, 2013:  Damon Krueger is doing well. Still walking 20 miles a week. He's not had any palpitations. He denies any chest pain or shortness of breath  Sept. 24, 2015:  Damon Krueger is doing . No arrhythmias. No CP. No dyspnea. Exercising regularly He measures his blood pressure at home on a regular basis. His typical readings were in the 110/73 range    April 08, 2014:   Damon Hector, Damon Krueger is a 69 y.o. male who presents for follow-up of his atrial fibrillation and hypertension. BP is typically much better at home.  BP is a bit high  tonight .   He complains of having "floaters" in his visual field.    [name]/ 10, 2017"  He developed an allergic reaction to the Enalapril.   It was stopped.  BP runs high on occasion Is back exercising - walks / jogs 25 miles a week .    August 16, 2015:  Doing well .   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  April 05, 2016:  Feeling ok BP has been elevated.  Actually very variable Does not eat salt.  Goes out to eat once a week - chooses a low salt option  Went to the ER 2 weeks ago Amlodipine was increased to 7.5 mg a day   July 09, 2016:  Has been doing well  We added Valsartan  Exercising some   March 18, 2017:  As a as seen today for for follow-up of his paroxysmal atrial fibrillation and hypertension. Notes occasional palpitations - last for several minutes.  No associated dizziness or sweats or  CP or dyspnea  Occurs when he is under stress Exercising regularly .    Sept. 28. 2020  Damon Krueger is seen for follow up of his PAF, HTN We had a virtual visit this past spring . Exercising regulalry  Watches his salt intake   Oct. 4, 2021:  Damon Krueger is seen today for follow up of his  PAF and HTN. No palpitations, no  CP or dyspnea  Exercises regularly  Has had 3rd covid vaccine   Past Medical History:  Diagnosis Date  . Abnormal urinalysis    a. Tr Hgb by UA 02/2013 - instructed to f/u PCP.  [name] Atrial fibrillation with rapid ventricular response ([place]) 02/10/2013  . Chronic anticoagulation 12/01/2013  . Diabetes ([place]) 02/10/2013  . Diabetes mellitus   . Diverticulosis   . Essential hypertension   . Gas 03/09/2014  . History of colonic polyps 02/25/2017  . HTN (hypertension) 02/10/2013  . Internal hemorrhoid   . Loose stools 03/09/2014  . PAF (paroxysmal atrial fibrillation) ([place])    a. Dx 02/2013. Placed on Cardizem, Eliquis (CHA2DS2VASc = 2);  b. 02/2013 Echo: EF 50-55%, mildly dil LA, mild LVH.  [name] Prostate cancer ([place])   .  Special screening for malignant neoplasms, colon 12/01/2013  . Tubular adenoma of colon     Past Surgical History:  Procedure Laterality Date  . CARDIOVERSION N/A 03/18/2013   Procedure: CARDIOVERSION;  Surgeon: Candee Furbish, MD;  Location: [facility] ENDOSCOPY;  Service: Cardiovascular;  Laterality: N/A;  pt shocked x4, starting with 120 joules, 150, 200 and 299 joules without successful conversion...Dr. Marlou Porch will have pt follow up with cardiologists...  . COLONOSCOPY    . POLYPECTOMY    . PROSTATE SURGERY  2011     Current Outpatient Medications  Medication Sig Dispense Refill  . ACCU-CHEK AVIVA PLUS test strip     . acetaminophen (TYLENOL) 325 MG tablet Take 650 mg by mouth every 6 (six) hours as needed for mild pain.    [name] allopurinol (ZYLOPRIM) 300 MG tablet Take 300 mg by mouth daily.     [name] amLODipine (NORVASC) 10 MG tablet TAKE 1 TABLET DAILY 90 tablet 0  . apixaban (ELIQUIS) 5 MG TABS tablet Take 1 tablet (5 mg total) by mouth 2 (two) times daily. 180 tablet 1  . atorvastatin (LIPITOR) 40 MG tablet Take 40 mg by mouth daily.     . carvedilol (COREG) 12.5 MG tablet TAKE 1 TABLET 2 TIMES DAILY 180 tablet 2  . flecainide (TAMBOCOR) 50 MG tablet TAKE 1 TABLET TWICE A DAY 180 tablet 0  . glimepiride (AMARYL) 2 MG tablet Take 1 tablet by mouth daily.    . hydrochlorothiazide (HYDRODIURIL) 25 MG tablet TAKE 1 TABLET DAILY. 90 tablet 2  . KLOR-CON M10 10 MEQ tablet TAKE 1 TABLET DAILY 90 tablet 1  . metFORMIN (GLUCOPHAGE) 1000 MG tablet Take 1,000 mg by mouth 2 (two) times daily.    [name] DELICA LANCETS 37C MISC     . tamsulosin (FLOMAX) 0.4 MG CAPS capsule Take 0.4 mg by mouth daily as needed (for urinary frequency).    . valsartan (DIOVAN) 80 MG tablet TAKE 1 TABLET BY MOUTH EVERY DAY 90 tablet 2   Current Facility-Administered Medications  Medication Dose Route Frequency Provider Last Rate Last Admin  . 0.9 %  sodium chloride infusion  500 mL Intravenous Once Ladene Artist, MD         Allergies:   Enalapril    Social History:  The patient  reports that he has never smoked. He has never used smokeless tobacco. He reports current alcohol use. He reports that he does not use drugs.   Family History:  The patient's family history includes Diabetes in his sister; Heart attack in his brother; Hypertension in an other family member.    ROS:  As per current hx,  otherwise all systems are negative    Physical Exam: Blood pressure 108/76, pulse 70, height 5\' 11"  (1.803 m), weight 198 lb 6.4 oz (90 kg), SpO2 95 %.  GEN:  Well nourished, well developed in no acute distress HEENT: Normal NECK: No JVD; No carotid bruits LYMPHATICS: No lymphadenopathy CARDIAC: RRR , no murmurs, rubs, gallops RESPIRATORY:  Clear to auscultation without rales, wheezing or rhonchi  ABDOMEN: Soft, non-tender, non-distended MUSCULOSKELETAL:  No edema; No deformity  SKIN: Warm and dry NEUROLOGIC:  Alert and oriented x 3   EKG:  Oct. 4, 2021:  NSR at 69.  No ST or T wave abn.    Recent Labs: 11/05/2019: ALT 16; BUN 23; Creatinine, Ser 1.19; Potassium 4.1; Sodium 137    Lipid Panel    Component Value Date/Time   CHOL 160 11/05/2019 1154   TRIG 61 11/05/2019 1154   HDL 66 11/05/2019 1154   CHOLHDL 2.4 11/05/2019 1154   LDLCALC 82 11/05/2019 1154      Wt Readings from Last 3 Encounters:  11/08/19 198 lb 6.4 oz (90 kg)  11/02/18 199 lb 12.8 oz (90.6 kg)  05/06/18 196 lb (88.9 kg)      Other studies Reviewed: Additional studies/ records that were reviewed today include: . Review of the above records demonstrates:    ASSESSMENT AND PLAN:  1.  Paroxysmal atrial fibrillation.  Is on eliquis, flecainide , coreg.   Remains in NSR . Cont meds  2. Essential hypertension:    BP is well controlled.    3.  Hyperlipidemia:  Lipids from Oct. 1 look great.    Current medicines are reviewed at length with the patient today.  The patient does not have concerns regarding  medicines.  Signed, Mertie Moores, MD  11/08/2019 10:53 AM    [facility] [place], [place], [place]  [address] Phone: [phone]; Fax: [phone]

## 2019-11-08 ENCOUNTER — Encounter: Payer: Self-pay | Admitting: Cardiovascular Disease

## 2019-11-08 ENCOUNTER — Ambulatory Visit (INDEPENDENT_AMBULATORY_CARE_PROVIDER_SITE_OTHER): Payer: BC Managed Care – PPO | Admitting: Cardiovascular Disease

## 2019-11-08 ENCOUNTER — Other Ambulatory Visit: Payer: Self-pay

## 2019-11-08 VITALS — BP 108/76 | HR 70 | Ht 71.0 in | Wt 198.4 lb

## 2019-11-08 DIAGNOSIS — I10 Essential (primary) hypertension: Secondary | ICD-10-CM

## 2019-11-08 DIAGNOSIS — E782 Mixed hyperlipidemia: Secondary | ICD-10-CM

## 2019-11-08 DIAGNOSIS — Z7901 Long term (current) use of anticoagulants: Secondary | ICD-10-CM | POA: Diagnosis not present

## 2019-11-08 DIAGNOSIS — I4819 Other persistent atrial fibrillation: Secondary | ICD-10-CM

## 2019-11-08 DIAGNOSIS — E785 Hyperlipidemia, unspecified: Secondary | ICD-10-CM | POA: Insufficient documentation

## 2019-11-08 NOTE — Patient Instructions (Signed)
Medication Instructions:  Your physician recommends that you continue on your current medications as directed. Please refer to the Current Medication list given to you today.  *If you need a refill on your cardiac medications before your next appointment, please call your pharmacy*   Lab Work: None Ordered If you have labs (blood work) drawn today and your tests are completely normal, you will receive your results only by: [name] MyChart Message (if you have MyChart) OR . A paper copy in the mail If you have any lab test that is abnormal or we need to change your treatment, we will call you to review the results.   Testing/Procedures: None Ordered   Follow-Up: At [facility], you and your health needs are our priority.  As part of our continuing mission to provide you with exceptional heart care, we have created designated Provider Care Teams.  These Care Teams include your primary Cardiologist (physician) and Advanced Practice Providers (APPs -  Physician Assistants and Nurse Practitioners) who all work together to provide you with the care you need, when you need it.  Your next appointment:   1 year(s)  The format for your next appointment:   In Person  Provider:   You may see Mertie Moores, MD or one of the following Advanced Practice Providers on your designated Care Team:    Richardson Dopp, PA-C  Robbie Lis, [place]    Other Instructions You can call [phone] to see if you qualify for assistance with the cost of Eliquis

## 2019-12-01 ENCOUNTER — Other Ambulatory Visit: Payer: Self-pay | Admitting: Cardiovascular Disease

## 2019-12-16 ENCOUNTER — Other Ambulatory Visit: Payer: Self-pay

## 2019-12-16 MED ORDER — FLECAINIDE ACETATE 50 MG PO TABS
50.0000 mg | ORAL_TABLET | Freq: Two times a day (BID) | ORAL | 3 refills | Status: DC
Start: 2019-12-16 — End: 2020-12-22

## 2020-02-22 ENCOUNTER — Other Ambulatory Visit: Payer: Self-pay | Admitting: Cardiovascular Disease

## 2020-02-22 DIAGNOSIS — I4819 Other persistent atrial fibrillation: Secondary | ICD-10-CM

## 2020-02-22 NOTE — Telephone Encounter (Signed)
Prescription refill request for Eliquis received. Indication: a fib Last office visit: 11/08/19 Scr: 1.19 Age: 70 Weight: 90kg

## 2020-02-23 ENCOUNTER — Telehealth: Payer: Self-pay | Admitting: Pharmacist

## 2020-02-23 NOTE — Telephone Encounter (Signed)
PA for Eliquis submitted to [facility]

## 2020-02-23 NOTE — Telephone Encounter (Signed)
Prior authorization for Eliquis approved through 02/22/21. Approval letter faxed to retail pharmacy so they are aware to process rx.

## 2020-03-17 ENCOUNTER — Other Ambulatory Visit: Payer: Self-pay | Admitting: Physician Assistant

## 2020-04-27 ENCOUNTER — Telehealth: Payer: Self-pay | Admitting: Cardiovascular Disease

## 2020-04-27 NOTE — Telephone Encounter (Signed)
RN spoke with patient regarding medication question. RN informed patient that I was unaware of any recall on hydrochlorothiazide at this time, but I would send a message to our Pharmacist and Dr.Nahser for advice. Patient in agreement. Patient states he wanted to clarify that the medication was still safe and effective.   Message sent to PharmD and Dr.Nahser.

## 2020-04-27 NOTE — Telephone Encounter (Signed)
Recall is on quinapril HCl/hydrochlorothiazide medication and NOT on HCTZ as single product. Talked to patient. Expressed understanding. No additional questions at this time.

## 2020-04-27 NOTE — Telephone Encounter (Signed)
Pt c/o medication issue:  1. Name of Medication: hydrochlorothiazide (HYDRODIURIL) 25 MG tablet  2. How are you currently taking this medication (dosage and times per day)? As prescribed  3. Are you having a reaction (difficulty breathing--STAT)? No  4. What is your medication issue?   Patient states he recently read about a recall on hydrochlorothiazide and he would like to know if he needs to continue taking it. Please advise.

## 2020-05-03 ENCOUNTER — Other Ambulatory Visit: Payer: Self-pay | Admitting: Cardiovascular Disease

## 2020-07-27 ENCOUNTER — Other Ambulatory Visit: Payer: Self-pay | Admitting: Cardiovascular Disease

## 2020-08-17 ENCOUNTER — Encounter: Payer: Self-pay | Admitting: Gastroenterology

## 2020-08-21 ENCOUNTER — Other Ambulatory Visit: Payer: Self-pay | Admitting: *Deleted

## 2020-08-21 DIAGNOSIS — I4819 Other persistent atrial fibrillation: Secondary | ICD-10-CM

## 2020-08-21 MED ORDER — APIXABAN 5 MG PO TABS: 5.0000 mg | ORAL_TABLET | Freq: Two times a day (BID) | ORAL | 1 refills | Status: DC

## 2020-08-21 NOTE — Telephone Encounter (Signed)
Prescription refill request for Eliquis received.  Indication: afib  Last office visit: nahser, 11/08/2019 Scr: 1.19, 11/05/2019 Age: 70 yo  Weight:  90 kg   Pt is on the correct dose of Eliquis per dosing criteria. Prescription sent to [facility].

## 2020-09-27 ENCOUNTER — Other Ambulatory Visit: Payer: Self-pay

## 2020-09-27 ENCOUNTER — Encounter: Payer: Self-pay | Admitting: Nurse Practitioner

## 2020-09-27 ENCOUNTER — Ambulatory Visit: Payer: BC Managed Care – PPO | Admitting: Nurse Practitioner

## 2020-09-27 ENCOUNTER — Other Ambulatory Visit: Payer: BC Managed Care – PPO | Admitting: *Deleted

## 2020-09-27 ENCOUNTER — Other Ambulatory Visit: Payer: Self-pay | Admitting: *Deleted

## 2020-09-27 ENCOUNTER — Telehealth: Payer: Self-pay

## 2020-09-27 DIAGNOSIS — I480 Paroxysmal atrial fibrillation: Secondary | ICD-10-CM

## 2020-09-27 DIAGNOSIS — Z79899 Other long term (current) drug therapy: Secondary | ICD-10-CM

## 2020-09-27 DIAGNOSIS — Z860100 Personal history of colon polyps, unspecified: Secondary | ICD-10-CM

## 2020-09-27 LAB — CBC
Hematocrit: 42.2 % (ref 37.5–51.0)
Hemoglobin: 14.4 g/dL (ref 13.0–17.7)
MCH: 32.3 pg (ref 26.6–33.0)
MCHC: 34.1 g/dL (ref 31.5–35.7)
MCV: 95 fL (ref 79–97)
Platelets: 159 10*3/uL (ref 150–450)
RBC: 4.46 x10E6/uL (ref 4.14–5.80)
RDW: 13.8 % (ref 11.6–15.4)
WBC: 7 10*3/uL (ref 3.4–10.8)

## 2020-09-27 NOTE — Progress Notes (Signed)
09/27/2020 Damon Hector, PhD [id number] [birth year]   CHIEF COMPLAINT: Schedule a colonoscopy   HISTORY OF PRESENT ILLNESS: Damon Krueger is a 70 year old male with a past medical history of hypertension, atrial fibrillation s/p failed cardioversion 03/2013 and subsequently converted to NSR on Flecainide and remains on Eliquis, DM II, prostate cancer s/p seed implant 2011 and colon polyps.   He presents to our office today to schedule a colon polyp surveillance colonoscopy.  Most recent colonoscopy by Dr. Fuller Plan was completed 03/28/2017 which identified a 5 mm polyp which was removed from the cecum and three 6 - 8 mm polyps were removed from the rectum, descending colon and at the hepatic flexure.  Biopsy results were consistent one tubular adenoma and  3 sessile serrated polyps without dysplasia.  He was advised to repeat a colonoscopy in 3 years.  He has some upset stomach which he describes as passing a nonbloody loose stool 2 to 3 days weekly for at least the past 3 years. If he takes Align probiotic his loose stools abate. He questions if his loose stools are secondary to taking Metformin. He passes a normal formed stool the other days of the week. Limited dairy products. No recent antibiotics or new medications. No upper or lower abdominal pain. No rectal bleeding or black stools.No GERD symptoms.   He has a history hypertension and atrial fibrillation on Eliquis, Flecainide and Carvedilol. He was last seen by his cardiologist Dr. Mertie Moores on 11/08/2019.  At that time, his cardiac status was stable, he remained in a normal sinus rhythm. No chest pain, palpitations or shortness of breath.  He reported having routine labs done by his PCP April 2022 which were normal. I will request a copy of these results for further review.   CBC Latest Ref Rng & Units 06/23/2018 03/24/2016 03/09/2013  WBC 3.4 - 10.8 x10E3/uL 6.7 7.2 7.4  Hemoglobin 13.0 - 17.7 g/dL 15.2 15.8 15.6  Hematocrit 37.5 -  51.0 % 44.4 45.6 48.6  Platelets 150 - 450 x10E3/uL 151 151 166.0    CMP Latest Ref Rng & Units 11/05/2019 06/23/2018 10/17/2017  Glucose 65 - 99 mg/dL 149(H) 104(H) 290(H)  BUN 8 - 27 mg/dL '23 23 22  '$ Creatinine 0.76 - 1.27 mg/dL 1.19 1.02 1.07  Sodium 134 - 144 mmol/L 137 139 138  Potassium 3.5 - 5.2 mmol/L 4.1 4.4 4.1  Chloride 96 - 106 mmol/L 100 101 98  CO2 20 - 29 mmol/L '23 26 23  '$ Calcium 8.6 - 10.2 mg/dL 9.5 9.7 9.7  Total Protein 6.0 - 8.5 g/dL 6.8 6.6 -  Total Bilirubin 0.0 - 1.2 mg/dL 0.4 0.6 -  Alkaline Phos 44 - 121 IU/L 63 86 -  AST 0 - 40 IU/L 17 16 -  ALT 0 - 44 IU/L 16 16 -     Colonoscopy 03/28/2017 by Dr. Fuller Plan: - One 5 mm polyp in the cecum, removed with a cold biopsy forceps. Resected and retrieved. - Three 6 to 8 mm polyps in the rectum, in the descending colon and at the hepatic flexure, removed with a cold snare. Resected and retrieved. - Mild diverticulosis in the transverse colon. There was no evidence of diverticular bleeding. - Moderate diverticulosis in the left colon. There was no evidence of diverticular bleeding. - Internal hemorrhoids. - The examination was otherwise normal on direct and retroflexion views. -3 year colonoscopy recall Biopsy result: Surgical [P], cecal, hepatic flexure, descending, rectal, polyp (4) -  TUBULAR ADENOMA(S). - SESSILE SERRATED POLYP WITHOUT CYTOLOGIC DYSPLASIA. - HIGH GRADE DYSPLASIA IS NOT IDENTIFIED  Colonoscopy 01/17/2014: 1. Pedunculated polyp in the transverse colon; polypectomy performed using snare cautery 2. Pedunculated polyp in the rectum; polypectomy performed using snare cautery 3. Sessile polyp in the rectum; polypectomy performed with a cold snare 4. Moderate diverticulosis in the left colon 5. Grade Il internal hemorrhoids Biopsy report: 1. Surgical [P], transverse, polyp - TRADITIONAL SERRATED ADENOMA. NO HIGH GRADE DYSPLASIA OR MALIGNANCY IDENTIFIED. 2. Surgical [P], rectum, polyp - LIPOMA. NO  ADENOMATOUS CHANGE OR MALIGNANCY. 3. Surgical [P], rectum, polyp - TUBULAR ADENOMA. NO HIGH GRADE DYSPLASIA OR MALIGNANCY IDENTIFIED.  Colonoscopy 02/23/2003: Descending and sigmoid: Diverticulosis No polyps  ECHO 02/11/2013: LV EF 50 - 50%  Past Medical History:  Diagnosis Date   Abnormal urinalysis    a. Tr Hgb by UA 02/2013 - instructed to f/u PCP.   Atrial fibrillation with rapid ventricular response ([place]) 02/10/2013   Chronic anticoagulation 12/01/2013   Diabetes ([place]) 02/10/2013   Diabetes mellitus    Diverticulosis    Essential hypertension    Gas 03/09/2014   History of colonic polyps 02/25/2017   HTN (hypertension) 02/10/2013   Internal hemorrhoid    Loose stools 03/09/2014   PAF (paroxysmal atrial fibrillation) ([place])    a. Dx 02/2013. Placed on Cardizem, Eliquis (CHA2DS2VASc = 2);  b. 02/2013 Echo: EF 50-55%, mildly dil LA, mild LVH.   Prostate cancer [facility])    Special screening for malignant neoplasms, colon 12/01/2013   Tubular adenoma of colon    Past Surgical History:  Procedure Laterality Date   CARDIOVERSION N/A 03/18/2013   Procedure: CARDIOVERSION;  Surgeon: Candee Furbish, MD;  Location: [facility] ENDOSCOPY;  Service: Cardiovascular;  Laterality: N/A;  pt shocked x4, starting with 120 joules, 150, 200 and 299 joules without successful conversion...Dr. Marlou Porch will have pt follow up with cardiologists...   COLONOSCOPY     POLYPECTOMY     PROSTATE SURGERY  2011  Exploratory laparoscopy 30 years ago, no significant findings per the patient's report He denies any problems with sedation/anesthesia or intubation/airway management during any past procedure or surgery.   Social History: He is a Arts development officer. He drinks 2 beers or brandy weekly or less. No drug use.   Family History: Brother heart disease. No family history of esophageal, gastric or colon cancer.     Outpatient Encounter Medications as of 09/27/2020  Medication Sig   ACCU-CHEK AVIVA PLUS test strip    acetaminophen  (TYLENOL) 325 MG tablet Take 650 mg by mouth every 6 (six) hours as needed for mild pain.   allopurinol (ZYLOPRIM) 300 MG tablet Take 300 mg by mouth daily.    amLODipine (NORVASC) 10 MG tablet Take 1 tablet (10 mg total) by mouth daily.   apixaban (ELIQUIS) 5 MG TABS tablet Take 1 tablet (5 mg total) by mouth 2 (two) times daily.   atorvastatin (LIPITOR) 40 MG tablet Take 40 mg by mouth daily.    carvedilol (COREG) 12.5 MG tablet TAKE 1 TABLET 2 TIMES DAILY   flecainide (TAMBOCOR) 50 MG tablet Take 1 tablet (50 mg total) by mouth 2 (two) times daily.   glimepiride (AMARYL) 2 MG tablet Take 1 tablet by mouth daily.   hydrochlorothiazide (HYDRODIURIL) 25 MG tablet TAKE 1 TABLET DAILY.   KLOR-CON M10 10 MEQ tablet TAKE 1 TABLET DAILY   metFORMIN (GLUCOPHAGE) 1000 MG tablet Take 1,000 mg by mouth 2 (two) times daily.   ONETOUCH DELICA LANCETS  33G MISC    tamsulosin (FLOMAX) 0.4 MG CAPS capsule Take 0.4 mg by mouth daily as needed (for urinary frequency).   valsartan (DIOVAN) 80 MG tablet TAKE 1 TABLET BY MOUTH EVERY DAY   Facility-Administered Encounter Medications as of 09/27/2020  Medication   0.9 %  sodium chloride infusion   REVIEW OF SYSTEMS:  Gen: Denies fever, sweats or chills. No weight loss.  CV: Denies chest pain, palpitations or edema. Resp: Denies cough, shortness of breath of hemoptysis.  GI: See HPI.  GU : Denies urinary burning, blood in urine, increased urinary frequency or incontinence. MS: Denies joint pain, muscles aches or weakness. Derm: Denies rash, itchiness, skin lesions or unhealing ulcers. Psych: Denies depression, anxiety or memory loss. Heme: Denies bruising, bleeding. Neuro:  Denies headaches, dizziness or paresthesias. Endo:  Denies any problems with DM, thyroid or adrenal function.  PHYSICAL EXAM: There were no vitals taken for this visit. BP 112/78   Pulse 70   Ht '5\' 11"'$  (1.803 m)   Wt 199 lb 2 oz (90.3 kg)   BMI 27.77 kg/m  Wt Readings from Last 3  Encounters:  09/27/20 199 lb 2 oz (90.3 kg)  11/08/19 198 lb 6.4 oz (90 kg)  11/02/18 199 lb 12.8 oz (90.6 kg)    General: 70 year old male in no acute distress. Head: Normocephalic and atraumatic. Eyes:  Sclerae non-icteric, conjunctive pink. Ears: Normal auditory acuity. Mouth: Dentition intact. No ulcers or lesions.  Neck: Supple, no lymphadenopathy or thyromegaly.  Lungs: Clear bilaterally to auscultation without wheezes, crackles or rhonchi. Heart: Regular rate and rhythm. No murmur, rub or gallop appreciated.  Abdomen: Soft, nontender, protuberant, non distended. No masses. No hepatosplenomegaly. Normoactive bowel sounds x 4 quadrants. Small LUQ scar intact.  Rectal: Deferred.  Musculoskeletal: Symmetrical with no gross deformities. Skin: Warm and dry. No rash or lesions on visible extremities. Extremities: No edema. Neurological: Alert oriented x 4, no focal deficits.  Psychological:  Alert and cooperative. Normal mood and affect.  ASSESSMENT AND PLAN:  41) 70 year old male with a history of sessile serrated and tubular adenomatous colon polyps -Colonoscopy benefits and risks discussed including risk with sedation, risk of bleeding, perforation and infection  -Our office will contact cardiologist Dr. Elmarie Shiley office to verify Eliquis instructions prior to proceeding with a colonoscopy   2) Paroxysmal atrial fibrillation CHADS2-VASc=2 on Eliquis  3) Intermittent nonbloody loose stools, resolves when taking Align  -[employer] probiotic once daily -Follow up with PCP to possibly change Metformin to extended release  -Patient to call our office if his symptoms worsen  4) History of prostate cancer     CC:  Hulan Fess, MD

## 2020-09-27 NOTE — Progress Notes (Signed)
Reviewed and agree with management plan. Colonoscopy in 2019 showed four polyps: one TA and 3 SSPs.   Pricilla Riffle. Fuller Plan, MD [facility]

## 2020-09-27 NOTE — Telephone Encounter (Signed)
[facility] Pre-operative Risk Assessment     Request for surgical clearance:     Endoscopy Procedure  What type of surgery is being performed?     Colonoscopy   When is this surgery scheduled?     10-04-2020  What type of clearance is required ?   Pharmacy  Are there any medications that need to be held prior to surgery and how long? Eliquis x 2 days  Practice name and name of physician performing surgery?      [facility]  What is your office phone and fax number?      Phone- [phone]  [phone]  Anesthesia type (None, local, MAC, general) ?       MAC

## 2020-09-27 NOTE — Telephone Encounter (Signed)
Patient with diagnosis of atrial fibrillation on Eliquis for anticoagulation.    Procedure: colonoscopy Date of procedure: 10/04/20   CHA2DS2-VASc Score = 3  This indicates a 3.2% annual risk of stroke. The patient's score is based upon: CHF History: No HTN History: Yes Diabetes History: Yes Stroke History: No Vascular Disease History: No Age Score: 1 Gender Score: 0    CrCl 74 (with adjusted body weight)  Patient has not had CBC since 2020.  Please have that drawn for final determination on clearance.

## 2020-09-27 NOTE — Telephone Encounter (Signed)
Pt is scheduled to come in for labs today.

## 2020-09-27 NOTE — Telephone Encounter (Signed)
Please set this patient up for CBC so that pharmacy can make [facility] holding recommendations   Thank you

## 2020-09-27 NOTE — Progress Notes (Signed)
Please set this patient up for CBC so that pharmacy can make [facility] holding recommendations    Thank you         Electronically signed by Tommie Raymond, NP at 09/27/2020  1:39 PM

## 2020-09-27 NOTE — Patient Instructions (Addendum)
If you are age 68 or older, your body mass index should be between 23-30. Your Body mass index is 27.77 kg/m. If this is out of the aforementioned range listed, please consider follow up with your Primary Care Provider. __________________________________________________________  The  GI providers would like to encourage you to use [facility] to communicate with providers for non-urgent requests or questions.  Due to long hold times on the telephone, sending your provider a message by [facility] may be a faster and more efficient way to get a response.  Please allow 48 business hours for a response.  Please remember that this is for non-urgent requests.   You have been scheduled for a colonoscopy. Please follow written instructions given to you at your visit today.  Please pick up your prep supplies at the pharmacy within the next 1-3 days. If you use inhalers (even only as needed), please bring them with you on the day of your procedure.  You will be contacted by our office prior to your procedure for directions on holding your Eliquis.  If you do not hear from our office 1 week prior to your scheduled procedure, please call [phone] to discuss.    Due to recent changes in healthcare laws, you may see the results of your imaging and laboratory studies on MyChart before your provider has had a chance to review them.  We understand that in some cases there may be results that are confusing or concerning to you. Not all laboratory results come back in the same time frame and the provider may be waiting for multiple results in order to interpret others.  Please give [place] 48 hours in order for your provider to thoroughly review all the results before contacting the office for clarification of your results.   Please purchase the following medications over the counter and take as directed:  Please take Align probiotic once daily   We will contact your PCP for most recent lab work.  Thank you for  entrusting me with your care and choosing [facility].  Carl Best, NP

## 2020-09-28 ENCOUNTER — Encounter: Payer: Self-pay | Admitting: Gastroenterology

## 2020-09-28 NOTE — Telephone Encounter (Signed)
Patient advised that he has been given clearance to hold Eliquis 2 days prior to colonoscopy scheduled for 10-04-2020.  Patient advised to take last dose of Eliquis on 10-01-2020, and he will be advised when to restart Eliquis by Dr Fuller Plan after the procedure.  Patient agreed to plan and verbalized understanding.  No further questions.

## 2020-09-28 NOTE — Telephone Encounter (Signed)
    Patient Name: Damon EGGUM, PhD  DOB: 20-May-1950 MRN: [id number]  Primary Cardiologist: Mertie Moores, MD  Chart reviewed as part of pre-operative protocol coverage. Given past medical history and time since last visit, based on ACC/AHA guidelines, [name], PhD would be at acceptable risk for the planned procedure without further cardiovascular testing.   Patient with diagnosis of atrial fibrillation on Eliquis for anticoagulation.     Procedure: colonoscopy Date of procedure: 10/04/20   CHA2DS2-VASc Score = 3  This indicates a 3.2% annual risk of stroke. The patient's score is based upon: CHF History: No HTN History: Yes Diabetes History: Yes Stroke History: No Vascular Disease History: No Age Score: 1 Gender Score: 0    CBC stable with plt count 159K   Pt can hold Eliquis for 2 days prior to colonoscopy as requested.  The patient was advised that if he develops new symptoms prior to surgery to contact our office to arrange for a follow-up visit, and he verbalized understanding.  I will route this recommendation to the requesting party via Epic fax function and remove from pre-op pool.  Please call with questions.  Kathyrn Drown, NP 09/28/2020, 8:47 AM

## 2020-09-28 NOTE — Telephone Encounter (Signed)
CBC stable with plt count 159K  Pt can hold Eliquis for 2 days prior to colonoscopy as requested.

## 2020-09-28 NOTE — Telephone Encounter (Signed)
Left message on voicemail for patient to return call for further instructions on Eliquis hold prior to procedure.  Will continue efforts.

## 2020-10-04 ENCOUNTER — Ambulatory Visit (AMBULATORY_SURGERY_CENTER): Payer: BC Managed Care – PPO | Admitting: Gastroenterology

## 2020-10-04 ENCOUNTER — Other Ambulatory Visit: Payer: Self-pay

## 2020-10-04 ENCOUNTER — Encounter: Payer: Self-pay | Admitting: Gastroenterology

## 2020-10-04 VITALS — BP 112/84 | HR 54 | Temp 97.5°F | Resp 14 | Ht 71.0 in | Wt 199.0 lb

## 2020-10-04 DIAGNOSIS — D123 Benign neoplasm of transverse colon: Secondary | ICD-10-CM

## 2020-10-04 DIAGNOSIS — Z860100 Personal history of colon polyps, unspecified: Secondary | ICD-10-CM

## 2020-10-04 MED ORDER — SODIUM CHLORIDE 0.9 % IV SOLN: 500.0000 mL | Freq: Once | INTRAVENOUS | Status: DC

## 2020-10-04 NOTE — Patient Instructions (Signed)
Handout on polyps, diverticulosis given.  Resume Eliquis in two days at prior dose.  No aspirin, ibuprofen, naproxen or other non-steroidal anti-inflammatory drugs for two weeks.  High fiber diet.    YOU HAD AN ENDOSCOPIC PROCEDURE TODAY AT [facility]:   Refer to the procedure report that was given to you for any specific questions about what was found during the examination.  If the procedure report does not answer your questions, please call your gastroenterologist to clarify.  If you requested that your care partner not be given the details of your procedure findings, then the procedure report has been included in a sealed envelope for you to review at your convenience later.  YOU SHOULD EXPECT: Some feelings of bloating in the abdomen. Passage of more gas than usual.  Walking can help get rid of the air that was put into your GI tract during the procedure and reduce the bloating. If you had a lower endoscopy (such as a colonoscopy or flexible sigmoidoscopy) you may notice spotting of blood in your stool or on the toilet paper. If you underwent a bowel prep for your procedure, you may not have a normal bowel movement for a few days.  Please Note:  You might notice some irritation and congestion in your nose or some drainage.  This is from the oxygen used during your procedure.  There is no need for concern and it should clear up in a day or so.  SYMPTOMS TO REPORT IMMEDIATELY:  Following lower endoscopy (colonoscopy or flexible sigmoidoscopy):  Excessive amounts of blood in the stool  Significant tenderness or worsening of abdominal pains  Swelling of the abdomen that is new, acute  Fever of 100F or higher   For urgent or emergent issues, a gastroenterologist can be reached at any hour by calling [phone]. Do not use MyChart messaging for urgent concerns.    DIET:  We do recommend a small meal at first, but then you may proceed to your regular diet.  Drink plenty of  fluids but you should avoid alcoholic beverages for 24 hours.  ACTIVITY:  You should plan to take it easy for the rest of today and you should NOT DRIVE or use heavy machinery until tomorrow (because of the sedation medicines used during the test).    FOLLOW UP: Our staff will call the number listed on your records 48-72 hours following your procedure to check on you and address any questions or concerns that you may have regarding the information given to you following your procedure. If we do not reach you, we will leave a message.  We will attempt to reach you two times.  During this call, we will ask if you have developed any symptoms of COVID 19. If you develop any symptoms (ie: fever, flu-like symptoms, shortness of breath, cough etc.) before then, please call [phone].  If you test positive for Covid 19 in the 2 weeks post procedure, please call and report this information to [place].    If any biopsies were taken you will be contacted by phone or by letter within the next 1-3 weeks.  Please call us at [phone] if you have not heard about the biopsies in 3 weeks.    SIGNATURES/CONFIDENTIALITY: You and/or your care partner have signed paperwork which will be entered into your electronic medical record.  These signatures attest to the fact that that the information above on your After Visit Summary has been reviewed and is understood.  Full responsibility of the confidentiality of this discharge information lies with you and/or your care-partner.  

## 2020-10-04 NOTE — Progress Notes (Signed)
Called to room to assist during endoscopic procedure.  Patient ID and intended procedure confirmed with present staff. Received instructions for my participation in the procedure from the performing physician.  

## 2020-10-04 NOTE — Progress Notes (Signed)
Report to PACU, RN, vss, BBS= Clear.  

## 2020-10-04 NOTE — Progress Notes (Signed)
See 09/27/2020 H&P, no changes. This patient is appropriate for endoscopic procedures in the ambulatory setting.

## 2020-10-04 NOTE — Progress Notes (Signed)
[facility]  Pt's states no medical or surgical changes since previsit or office visit.  

## 2020-10-04 NOTE — Op Note (Addendum)
[name] Procedure Date: 10/04/2020 9:17 AM MRN: [id number] Endoscopist: Ladene Artist , MD Age: 70 Referring MD:  Date of Birth: November 10, 1950 Gender: Male Account #: [id number] Procedure:                Colonoscopy Indications:              Surveillance: Personal history of adenomatous                            polyps and sessile serrated polyps on last                            colonoscopy 3 years ago Medicines:                Monitored Anesthesia Care Procedure:                Pre-Anesthesia Assessment:                           - Prior to the procedure, a History and Physical                            was performed, and patient medications and                            allergies were reviewed. The patient's tolerance of                            previous anesthesia was also reviewed. The risks                            and benefits of the procedure and the sedation                            options and risks were discussed with the patient.                            All questions were answered, and informed consent                            was obtained. Prior Anticoagulants: The patient has                            taken Eliquis (apixaban), last dose was 2 days                            prior to procedure. ASA Grade Assessment: II - A                            patient with mild systemic disease. After reviewing                            the risks and benefits, the patient was deemed in  satisfactory condition to undergo the procedure.                           After obtaining informed consent, the colonoscope                            was passed under direct vision. Throughout the                            procedure, the patient's blood pressure, pulse, and                            oxygen saturations were monitored continuously. The                            Olympus CF-HQ190L (Serial# [id number])  Colonoscope was                            introduced through the anus and advanced to the the                            cecum, identified by appendiceal orifice and                            ileocecal valve. The ileocecal valve, appendiceal                            orifice, and rectum were photographed. The quality                            of the bowel preparation was adequate. The                            colonoscopy was somewhat difficult due to                            significant looping and a tortuous colon. The                            patient tolerated the procedure well. Scope In: 9:27:46 AM Scope Out: 9:50:23 AM Scope Withdrawal Time: 0 hours 11 minutes 47 seconds  Total Procedure Duration: 0 hours 22 minutes 37 seconds  Findings:                 The perianal and digital rectal examinations were                            normal.                           A 6 mm polyp was found in the transverse colon. The                            polyp was sessile. The polyp was removed with a  cold snare. Resection and retrieval were complete.                           A few small-mouthed diverticula were found in the                            right colon. There was no evidence of diverticular                            bleeding.                           Multiple medium-mouthed diverticula were found in                            the left colon. There was no evidence of                            diverticular bleeding.                           Internal hemorrhoids were found during                            retroflexion. The hemorrhoids were small and Grade                            I (internal hemorrhoids that do not prolapse).                           The exam was otherwise without abnormality on                            direct and retroflexion views. Complications:            No immediate complications. Estimated blood loss:                             None. Estimated Blood Loss:     Estimated blood loss: none. Impression:               - One 6 mm polyp in the transverse colon, removed                            with a cold snare. Resected and retrieved.                           - Mild diverticulosis in the right colon.                           - Moderate diverticulosis in the left colon.                           - Internal hemorrhoids.                           -  The examination was otherwise normal on direct                            and retroflexion views. Recommendation:           - Repeat colonoscopy after studies are complete for                            surveillance based on pathology results.                           - Resume Eliquis (apixaban) in 2 days at prior                            dose. Refer to managing physician for further                            adjustment of therapy.                           - Patient has a contact number available for                            emergencies. The signs and symptoms of potential                            delayed complications were discussed with the                            patient. Return to normal activities tomorrow.                            Written discharge instructions were provided to the                            patient.                           - High fiber diet.                           - Continue present medications.                           - Await pathology results.                           - No aspirin, ibuprofen, naproxen, or other                            non-steroidal anti-inflammatory drugs for 2 weeks                            after polyp removal. Ladene Artist, MD 10/04/2020 9:57:02 AM This report has been signed electronically.

## 2020-10-06 ENCOUNTER — Telehealth: Payer: Self-pay | Admitting: *Deleted

## 2020-10-06 NOTE — Telephone Encounter (Signed)
  Follow up Call-  Call back number 10/04/2020  Post procedure Call Back phone  # [phone]  Permission to leave phone message Yes  Some recent data might be hidden     Patient questions:  Do you have a fever, pain , or abdominal swelling? No. Pain Score  0 *  Have you tolerated food without any problems? Yes.    Have you been able to return to your normal activities? Yes.    Do you have any questions about your discharge instructions: Diet   No. Medications  No. Follow up visit  No.  Do you have questions or concerns about your Care? No.  Actions: * If pain score is 4 or above: No action needed, pain <4.  Have you developed a fever since your procedure? no  2.   Have you had an respiratory symptoms (SOB or cough) since your procedure? no  3.   Have you tested positive for COVID 19 since your procedure no  4.   Have you had any family members/close contacts diagnosed with the COVID 19 since your procedure?  no   If yes to any of these questions please route to Joylene John, RN and Joella Prince, RN

## 2020-10-11 ENCOUNTER — Other Ambulatory Visit: Payer: Self-pay | Admitting: Cardiovascular Disease

## 2020-10-19 ENCOUNTER — Encounter: Payer: Self-pay | Admitting: Gastroenterology

## 2020-10-28 IMAGING — DX RIGHT RIBS - 2 VIEW
3 series · 3 of 3 positions shown · non-contrast
Comparison: Radiographs March 24, 2016.

CLINICAL DATA: Right rib pain after fall.

EXAM:
RIGHT RIBS - 2 VIEW

[chest pa]
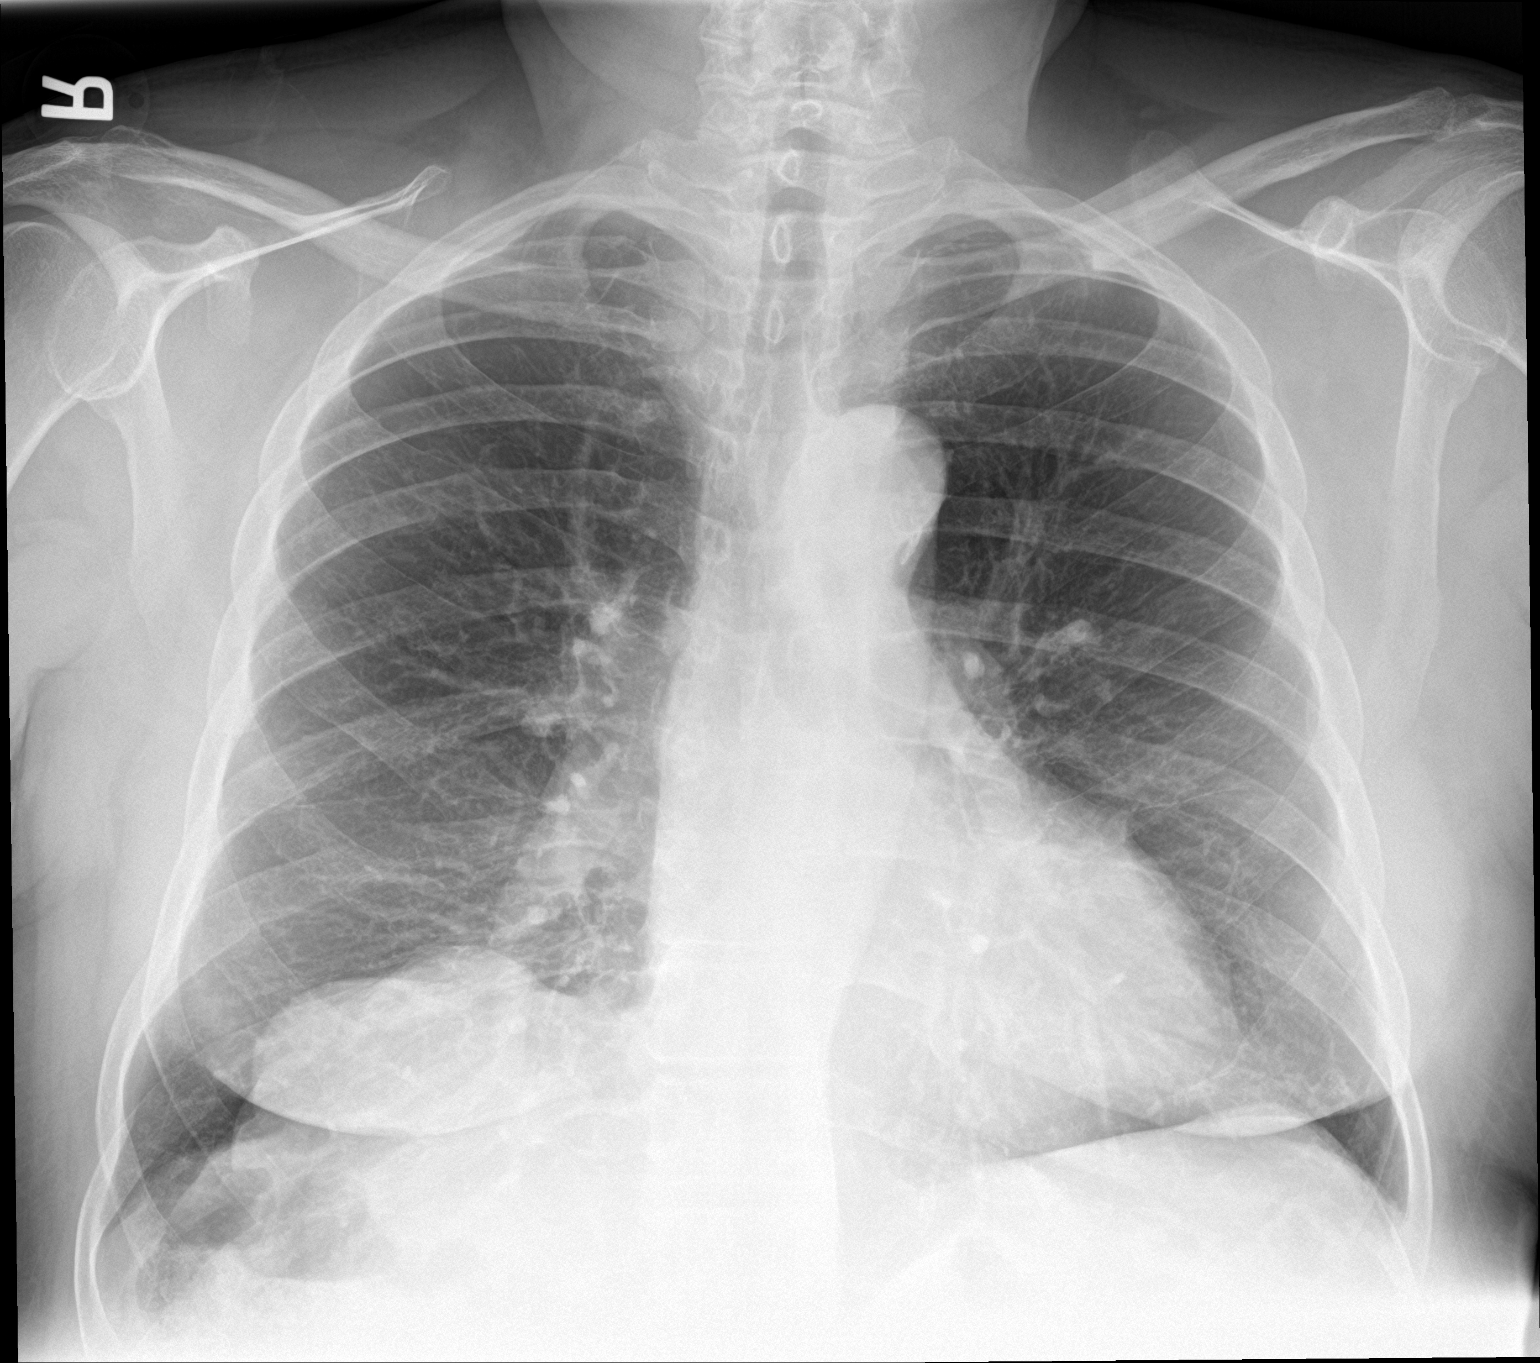

[rib pa]
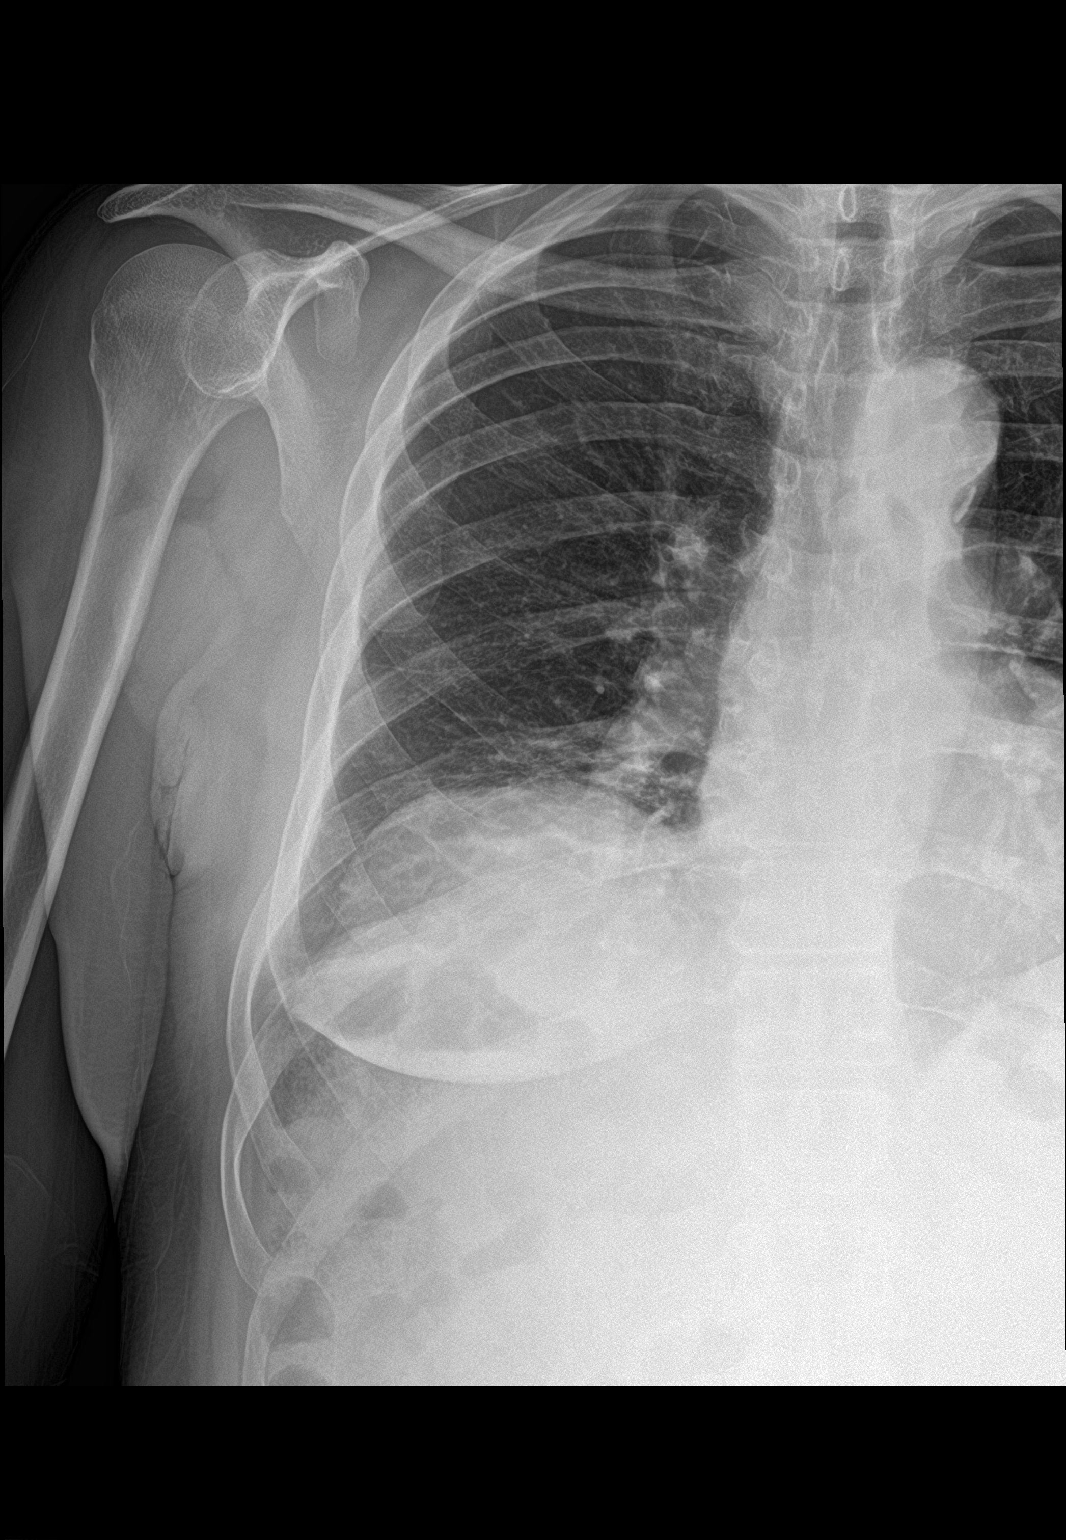

[rib obl]
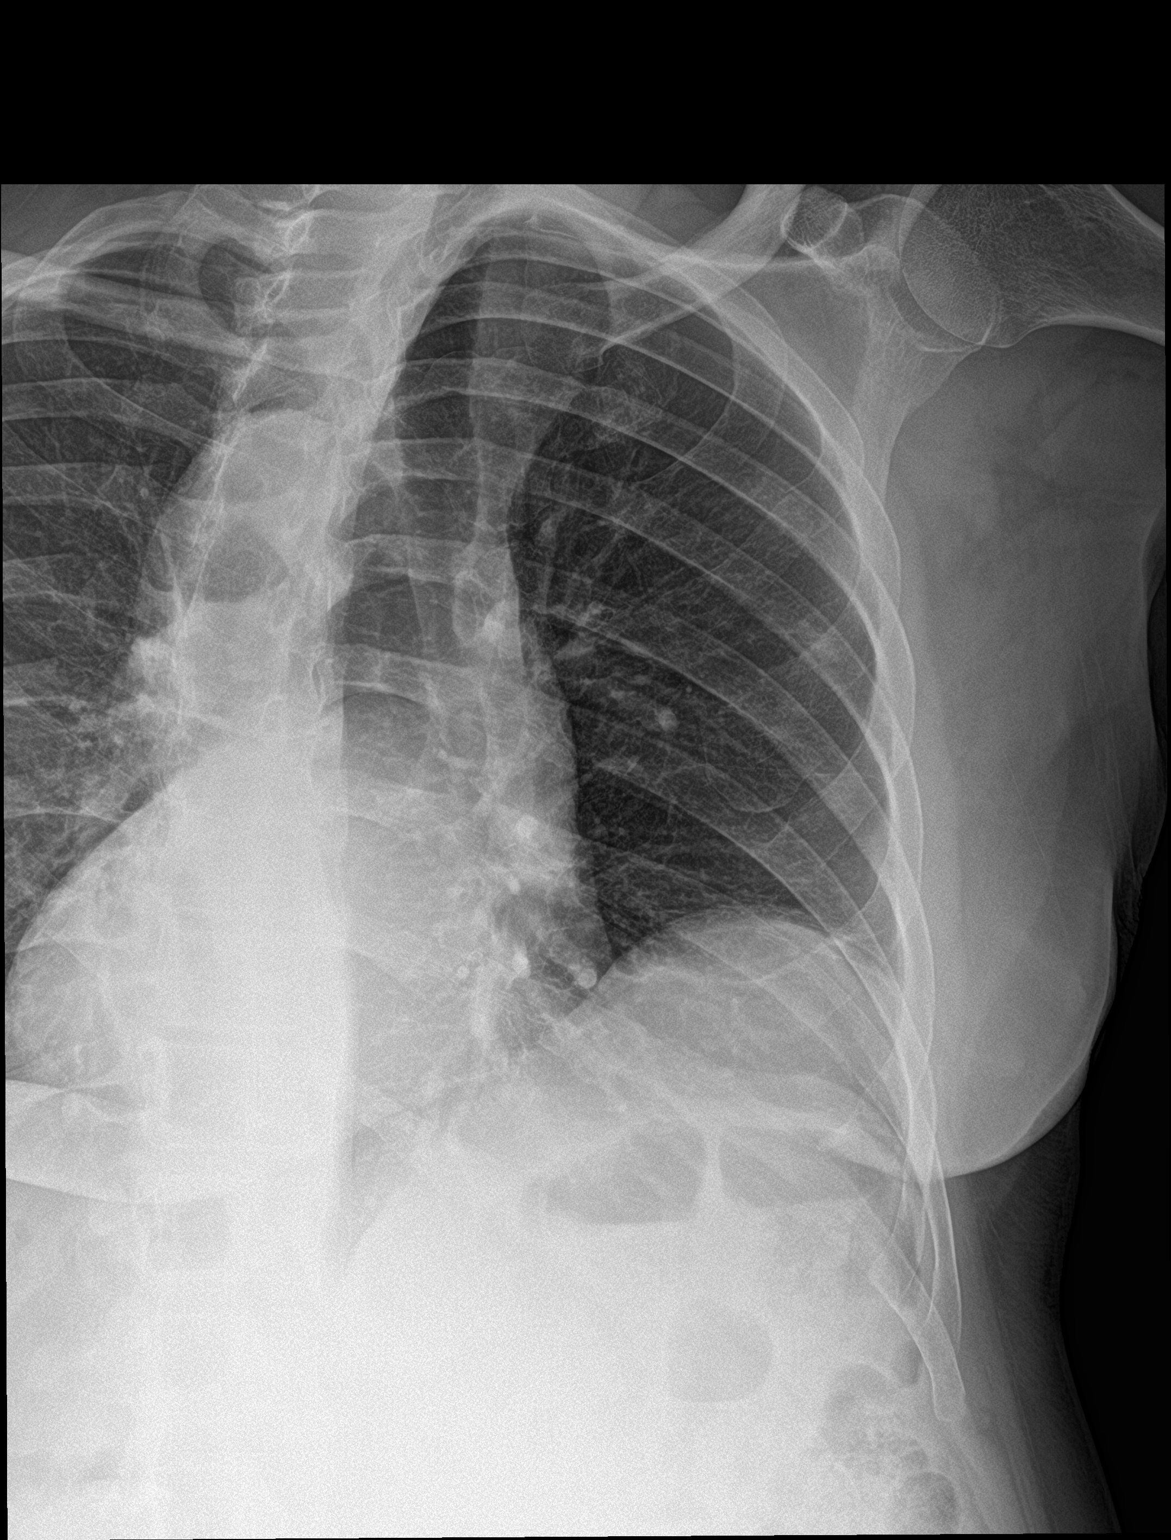

[3 of 3 positions shown; findings below may reference images not displayed]

FINDINGS: No fracture or other bone lesions are seen involving the ribs.
IMPRESSION: Negative.

## 2020-11-04 ENCOUNTER — Encounter: Payer: Self-pay | Admitting: Cardiovascular Disease

## 2020-11-04 NOTE — Progress Notes (Signed)
.   Cardiology Office Note   Date:  11/06/2020   ID:  Damon Hector, Damon Krueger, DOB April 01, 1950, MRN [id number]  PCP:  Hulan Fess, MD  Cardiologist:   Mertie Moores, MD   Chief Complaint  Patient presents with   Hypertension   Atrial Fibrillation   Problem list: 1. Essential hypertension 2. Diabetes mellitus 3. Prostate cancer 4. Paroxysmal Atrial fibrillation  Damon Krueger is a 70 y.o.  male [place] business professor at [employer] with a hx of HTN, DM, prostate CA.  He was recently admitted 1/7-1/8 with AFib with RVR in the setting of URI (Flu negative).  He was placed on CCB and Eliquis (CHADS2-VASc=2).  CEs were negative.  Echo (02/11/2013):  Mild LVH, EF 50-55%, mild LAE.  Plan was to proceed with DCCV if he did not convert to NSR on his own (? If viral illness resulted in AFib).    Feb. 13, 2015: He underwent cardioversion but unfortunately, he did not convert to sinus rhythm. His Cardizem was increased to 360 mg a day. He feels well. He's completely asymptomatic. He cannot tell that his heart rate is beating irregularly or not.  April 29, 2013:  The patient has failed cardioversion back in February ( as noted above) . We started him on flecainide and he converted to normal sinus rhythm. A stress Myoview study was negative for ischemia. He did not have any evidence of pro-arrhythmia. He's feeling quite well. He's walking about 20 miles a week.  July 14, 2013:  Damon Krueger is doing well.  Still walking 20 miles a week.  He's not had any palpitations. He denies any chest pain or shortness of breath  Sept. 24, 2015:  Damon Krueger is doing . No arrhythmias.  No CP. No dyspnea. Exercising regularly He measures his blood pressure at home on a regular basis. His typical readings were in the 110/73 range    April 08, 2014:   Damon Hector, Damon Krueger is a 70 y.o. male who presents for follow-up of his atrial fibrillation and hypertension. BP is typically much better at home.  BP is a bit high  tonight .   He complains of having "floaters" in his visual field.    [name]/ 10, 2017"  He developed an allergic reaction to the Enalapril.   It was stopped.  BP runs high on occasion Is back exercising - walks / jogs 25 miles a week .    August 16, 2015:  Doing well .   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  April 05, 2016:  Feeling ok BP has been elevated.  Actually very variable Does not eat salt.  Goes out to eat once a week - chooses a low salt option  Went to the ER 2 weeks ago Amlodipine was increased to 7.5 mg a day   July 09, 2016:  Has been doing well  We added Valsartan  Exercising some   March 18, 2017:  As a as seen today for for follow-up of his paroxysmal atrial fibrillation and hypertension. Notes occasional palpitations - last for several minutes.  No associated dizziness or sweats or  CP or dyspnea  Occurs when he is under stress Exercising regularly .    Sept. 28. 2020  Damon Krueger is seen for follow up of his PAF, HTN We had a virtual visit this past spring . Exercising regulalry  Watches his salt intake   Oct. 4, 2021:  Damon Krueger is seen today for follow up of his PAF and HTN. No palpitations, no  CP or dyspnea  Exercises regularly  Has had 3rd covid vaccine   Oct. 3, 2022: Damon Krueger is seen today for follow up for his PAF and HTN He is developed some musculoskeletal neck pain.  He is on Robaxin at bedtime for that.  He has a history of paroxysmal atrial fibrillation.  He is on flecainide.  He is maintaining sinus rhythm.  He is also on atorvastatin for his hyperlipidemia. Labs from Dr. Eddie Dibbles office were reviewed.  His last LDL is 76.  Triglyceride level is 74.  HDL is 61.  Total cholesterol is 152.   Past Medical History:  Diagnosis Date   Abnormal urinalysis    a. Tr Hgb by UA 02/2013 - instructed to f/u PCP.   Atrial fibrillation with rapid ventricular response ([place]) 02/10/2013   Chronic  anticoagulation 12/01/2013   Diabetes ([place]) 02/10/2013   Diabetes mellitus    Diverticulosis    Essential hypertension    Gas 03/09/2014   History of colonic polyps 02/25/2017   HTN (hypertension) 02/10/2013   Hyperlipidemia    Internal hemorrhoid    Loose stools 03/09/2014   PAF (paroxysmal atrial fibrillation) ([place])    a. Dx 02/2013. Placed on Cardizem, Eliquis (CHA2DS2VASc = 2);  b. 02/2013 Echo: EF 50-55%, mildly dil LA, mild LVH.   Prostate cancer [facility])    Special screening for malignant neoplasms, colon 12/01/2013   Tubular adenoma of colon     Past Surgical History:  Procedure Laterality Date   CARDIOVERSION N/A 03/18/2013   Procedure: CARDIOVERSION;  Surgeon: Candee Furbish, MD;  Location: [facility] ENDOSCOPY;  Service: Cardiovascular;  Laterality: N/A;  pt shocked x4, starting with 120 joules, 150, 200 and 299 joules without successful conversion...Dr. Marlou Porch will have pt follow up with cardiologists...   COLONOSCOPY     POLYPECTOMY     PROSTATE SURGERY  2011     Current Outpatient Medications  Medication Sig Dispense Refill   ACCU-CHEK AVIVA PLUS test strip      acetaminophen (TYLENOL) 325 MG tablet Take 650 mg by mouth every 6 (six) hours as needed for mild pain.     allopurinol (ZYLOPRIM) 300 MG tablet Take 300 mg by mouth daily.      amLODipine (NORVASC) 10 MG tablet Take 1 tablet (10 mg total) by mouth daily. 90 tablet 3   apixaban (ELIQUIS) 5 MG TABS tablet Take 1 tablet (5 mg total) by mouth 2 (two) times daily. 180 tablet 1   atorvastatin (LIPITOR) 40 MG tablet Take 40 mg by mouth daily.      carvedilol (COREG) 12.5 MG tablet TAKE 1 TABLET 2 TIMES DAILY 180 tablet 1   flecainide (TAMBOCOR) 50 MG tablet Take 1 tablet (50 mg total) by mouth 2 (two) times daily. 180 tablet 3   glimepiride (AMARYL) 2 MG tablet Take 1 tablet by mouth daily.     hydrochlorothiazide (HYDRODIURIL) 25 MG tablet TAKE 1 TABLET DAILY. 90 tablet 1   KLOR-CON M10 10 MEQ tablet TAKE 1 TABLET DAILY 90  tablet 0   metFORMIN (GLUCOPHAGE) 1000 MG tablet Take 1,000 mg by mouth 2 (two) times daily.     methocarbamol (ROBAXIN) 500 MG tablet Take 500 mg by mouth at bedtime.     ONETOUCH DELICA LANCETS 43P MISC      tamsulosin (FLOMAX) 0.4 MG CAPS capsule Take 0.4 mg by mouth daily as needed (for urinary frequency).  valsartan (DIOVAN) 80 MG tablet TAKE 1 TABLET BY MOUTH EVERY DAY 90 tablet 2   No current facility-administered medications for this visit.    Allergies:   Enalapril    Social History:  The patient  reports that he has never smoked. He has never used smokeless tobacco. He reports current alcohol use. He reports that he does not use drugs.   Family History:  The patient's family history includes Diabetes in his sister; Heart attack in his brother; Hypertension in an other family member.    ROS:  As per current hx, otherwise all systems are negative    Physical Exam: Blood pressure 122/88, pulse 65, height 5\' 11"  (1.803 m), weight 199 lb 6.4 oz (90.4 kg), SpO2 94 %.  GEN:  Well nourished, well developed in no acute distress HEENT: Normal NECK: No JVD; No carotid bruits LYMPHATICS: No lymphadenopathy CARDIAC: RRR , no murmurs, rubs, gallops RESPIRATORY:  Clear to auscultation without rales, wheezing or rhonchi  ABDOMEN: Soft, non-tender, non-distended MUSCULOSKELETAL:  No edema; No deformity  SKIN: Warm and dry NEUROLOGIC:  Alert and oriented x 3    EKG: November 06, 2020: Normal sinus rhythm at 65.  No ST or T wave changes.    Recent Labs: 09/27/2020: Hemoglobin 14.4; Platelets 159    Lipid Panel    Component Value Date/Time   CHOL 160 11/05/2019 1154   TRIG 61 11/05/2019 1154   HDL 66 11/05/2019 1154   CHOLHDL 2.4 11/05/2019 1154   LDLCALC 82 11/05/2019 1154      Wt Readings from Last 3 Encounters:  11/06/20 199 lb 6.4 oz (90.4 kg)  10/04/20 199 lb (90.3 kg)  09/27/20 199 lb 2 oz (90.3 kg)      Other studies Reviewed: Additional studies/ records  that were reviewed today include: . Review of the above records demonstrates:    ASSESSMENT AND PLAN:  1.  Paroxysmal atrial fibrillation.  Continue flecainide.  QRS duration is normal.  He is maintaining sinus rhythm.   2. Essential hypertension:   Blood pressure is well controlled.    3.  Hyperlipidemia:  His lipids are managed by his primary medical doctor.  His labs from May were reviewed and look good.  We will have him follow-up with me or an APP in 1 year.   Current medicines are reviewed at length with the patient today.  The patient does not have concerns regarding medicines.  Signed, Mertie Moores, MD  11/06/2020 10:06 AM    [facility] [address], [place], [place]  [address] Phone: [phone]; Fax: [phone]

## 2020-11-06 ENCOUNTER — Encounter: Payer: Self-pay | Admitting: Cardiovascular Disease

## 2020-11-06 ENCOUNTER — Ambulatory Visit: Payer: BC Managed Care – PPO | Admitting: Cardiovascular Disease

## 2020-11-06 ENCOUNTER — Other Ambulatory Visit: Payer: Self-pay

## 2020-11-06 VITALS — BP 122/88 | HR 65 | Ht 71.0 in | Wt 199.4 lb

## 2020-11-06 DIAGNOSIS — I10 Essential (primary) hypertension: Secondary | ICD-10-CM | POA: Diagnosis not present

## 2020-11-06 DIAGNOSIS — I4819 Other persistent atrial fibrillation: Secondary | ICD-10-CM

## 2020-11-06 DIAGNOSIS — E782 Mixed hyperlipidemia: Secondary | ICD-10-CM | POA: Diagnosis not present

## 2020-11-06 NOTE — Patient Instructions (Signed)
Medication Instructions:  Your physician recommends that you continue on your current medications as directed. Please refer to the Current Medication list given to you today.  *If you need a refill on your cardiac medications before your next appointment, please call your pharmacy*   Lab Work: NONE If you have labs (blood work) drawn today and your tests are completely normal, you will receive your results only by: [place] (if you have MyChart) OR A paper copy in the mail If you have any lab test that is abnormal or we need to change your treatment, we will call you to review the results.   Testing/Procedures: NONE   Follow-Up: At [facility], you and your health needs are our priority.  As part of our continuing mission to provide you with exceptional heart care, we have created designated Provider Care Teams.  These Care Teams include your primary Cardiologist (physician) and Advanced Practice Providers (APPs -  Physician Assistants and Nurse Practitioners) who all work together to provide you with the care you need, when you need it.   Your next appointment:   1 year(s)  The format for your next appointment:   In Person  Provider:   You may see Mertie Moores, MD or one of the following Advanced Practice Providers on your designated Care Team:   Richardson Dopp, PA-C [place], [place]

## 2020-11-08 ENCOUNTER — Other Ambulatory Visit: Payer: Self-pay | Admitting: Cardiovascular Disease

## 2020-12-10 ENCOUNTER — Other Ambulatory Visit: Payer: Self-pay | Admitting: Cardiovascular Disease

## 2020-12-22 ENCOUNTER — Telehealth: Payer: Self-pay | Admitting: Cardiovascular Disease

## 2020-12-22 MED ORDER — FLECAINIDE ACETATE 50 MG PO TABS: 50.0000 mg | ORAL_TABLET | Freq: Two times a day (BID) | ORAL | 3 refills | Status: DC

## 2020-12-22 NOTE — Telephone Encounter (Signed)
Pt's medication was sent to pt's pharmacy as requested. Confirmation received.  °

## 2020-12-22 NOTE — Telephone Encounter (Signed)
*  STAT* If patient is at the pharmacy, call can be transferred to refill team.   1. Which medications need to be refilled? (please list name of each medication and dose if known) flecainide (TAMBOCOR) 50 MG tablet  2. Which pharmacy/location (including street and city if local pharmacy) is medication to be sent to?[facility] [place], [place] to Registered [facility]  3. Do they need a 30 day or 90 day supply? 90 day

## 2020-12-25 ENCOUNTER — Telehealth: Payer: Self-pay | Admitting: Cardiovascular Disease

## 2020-12-25 NOTE — Telephone Encounter (Signed)
Patient states he is returning a call from this morning.

## 2021-01-09 ENCOUNTER — Other Ambulatory Visit: Payer: Self-pay | Admitting: Cardiovascular Disease

## 2021-01-13 ENCOUNTER — Other Ambulatory Visit: Payer: Self-pay | Admitting: Cardiovascular Disease

## 2021-02-16 ENCOUNTER — Other Ambulatory Visit: Payer: Self-pay | Admitting: Cardiovascular Disease

## 2021-02-16 DIAGNOSIS — I4819 Other persistent atrial fibrillation: Secondary | ICD-10-CM

## 2021-02-16 NOTE — Telephone Encounter (Signed)
Prescription refill request for Eliquis received. Indication: Afib  Last office visit:11/06/20 ([facility])  Scr: 1.11 (06/23/20 via [place])  Age: 71 Weight: 90.4kg  Appropriate dose and refill sent to requested pharmacy.

## 2021-08-15 ENCOUNTER — Other Ambulatory Visit: Payer: Self-pay | Admitting: Cardiovascular Disease

## 2021-08-15 DIAGNOSIS — I4819 Other persistent atrial fibrillation: Secondary | ICD-10-CM

## 2021-08-15 NOTE — Telephone Encounter (Signed)
Eliquis '5mg'$  refill request received. Patient is 71 years old, weight-90.4kg, Crea-1.00 on 06/27/2021 via [facility] from [place], [place], and last seen by Dr. Acie Fredrickson on 11/06/2020. Dose is appropriate based on dosing criteria. Will send in refill to requested pharmacy.

## 2021-09-05 ENCOUNTER — Other Ambulatory Visit: Payer: Self-pay | Admitting: Sports Medicine

## 2021-09-05 ENCOUNTER — Other Ambulatory Visit: Payer: Self-pay | Admitting: Cardiovascular Disease

## 2021-09-05 DIAGNOSIS — M5441 Lumbago with sciatica, right side: Secondary | ICD-10-CM

## 2021-09-17 ENCOUNTER — Ambulatory Visit
Admission: RE | Admit: 2021-09-17 | Discharge: 2021-09-17 | Disposition: A | Discharge status: Home / Self Care | Payer: BC Managed Care – PPO | Source: Ambulatory Visit | Attending: Sports Medicine | Admitting: Sports Medicine

## 2021-09-17 DIAGNOSIS — M5441 Lumbago with sciatica, right side: Secondary | ICD-10-CM

## 2021-10-20 ENCOUNTER — Other Ambulatory Visit: Payer: Self-pay | Admitting: Cardiovascular Disease

## 2021-11-11 ENCOUNTER — Encounter: Payer: Self-pay | Admitting: Cardiovascular Disease

## 2021-11-11 NOTE — Progress Notes (Unsigned)
.   Cardiology Office Note   Date:  11/12/2021   ID:  Damon Hector, Damon Krueger, DOB [birth year], MRN [id number]  PCP:  Hulan Fess, MD  Cardiologist:   Mertie Moores, MD   Chief Complaint  Patient presents with   Hypertension        Atrial Fibrillation   Hyperlipidemia   Problem list: 1. Essential hypertension 2. Diabetes mellitus 3. Prostate cancer 4. Paroxysmal Atrial fibrillation  Damon Krueger is a 71 y.o.  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  He was recently admitted 1/7-1/8 with AFib with RVR in the setting of URI (Flu negative).  He was placed on CCB and Eliquis (CHADS2-VASc=2).  CEs were negative.  Echo (02/11/2013):  Mild LVH, EF 50-55%, mild LAE.  Plan was to proceed with DCCV if he did not convert to NSR on his own (? If viral illness resulted in AFib).    Feb. 13, 2015: He underwent cardioversion but unfortunately, he did not convert to sinus rhythm. His Cardizem was increased to 360 mg a day. He feels well. He's completely asymptomatic. He cannot tell that his heart rate is beating irregularly or not.  April 29, 2013:  The patient has failed cardioversion back in February ( as noted above) . We started him on flecainide and he converted to normal sinus rhythm. A stress Myoview study was negative for ischemia. He did not have any evidence of pro-arrhythmia. He's feeling quite well. He's walking about 20 miles a week.  July 14, 2013:  Damon Krueger is doing well.  Still walking 20 miles a week.  He's not had any palpitations. He denies any chest pain or shortness of breath  Sept. 24, 2015:  Damon Krueger is doing . No arrhythmias.  No CP. No dyspnea. Exercising regularly He measures his blood pressure at home on a regular basis. His typical readings were in the 110/73 range    April 08, 2014:   Damon Hector, Damon Krueger is a 71 y.o. male who presents for follow-up of his atrial fibrillation and hypertension. BP is typically much better at  home.  BP is a bit high tonight .   He complains of having "floaters" in his visual field.    [name]/ 10, 2017"  He developed an allergic reaction to the Enalapril.   It was stopped.  BP runs high on occasion Is back exercising - walks / jogs 25 miles a week .    August 16, 2015:  Doing well .   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  April 05, 2016:  Feeling ok BP has been elevated.  Actually very variable Does not eat salt.  Goes out to eat once a week - chooses a low salt option  Went to the ER 2 weeks ago Amlodipine was increased to 7.5 mg a day   July 09, 2016:  Has been doing well  We added Valsartan  Exercising some   March 18, 2017:  As a as seen today for for follow-up of his paroxysmal atrial fibrillation and hypertension. Notes occasional palpitations - last for several minutes.  No associated dizziness or sweats or  CP or dyspnea  Occurs when he is under stress Exercising regularly .    Sept. 28. 2020  Damon Krueger is seen for follow up of his PAF, HTN We had a virtual visit this past spring . Exercising Public affairs consultant  his salt intake   Oct. 4, 2021:  Damon Krueger is seen today for follow up of his PAF and HTN. No palpitations, no  CP or dyspnea  Exercises regularly  Has had 3rd covid vaccine   Oct. 3, 2022: Damon Krueger is seen today for follow up for his PAF and HTN He is developed some musculoskeletal neck pain.  He is on Robaxin at bedtime for that.  He has a history of paroxysmal atrial fibrillation.  He is on flecainide.  He is maintaining sinus rhythm.  He is also on atorvastatin for his hyperlipidemia. Labs from Dr. Eddie Dibbles office were reviewed.  His last LDL is 76.  Triglyceride level is 74.  HDL is 61.  Total cholesterol is 152.  Oct. 9, 2023 : Damon Krueger is seen  Having issues with sciatica  Not able to exercise much     Past Medical History:  Diagnosis Date   Abnormal urinalysis    a. Tr Hgb by UA  02/2013 - instructed to f/u PCP.   Atrial fibrillation with rapid ventricular response ([place]) 02/10/2013   Chronic anticoagulation 12/01/2013   Diabetes ([place]) 02/10/2013   Diabetes mellitus    Diverticulosis    Essential hypertension    Gas 03/09/2014   History of colonic polyps 02/25/2017   HTN (hypertension) 02/10/2013   Hyperlipidemia    Internal hemorrhoid    Loose stools 03/09/2014   PAF (paroxysmal atrial fibrillation) ([place])    a. Dx 02/2013. Placed on Cardizem, Eliquis (CHA2DS2VASc = 2);  b. 02/2013 Echo: EF 50-55%, mildly dil LA, mild LVH.   Prostate cancer [facility])    Special screening for malignant neoplasms, colon 12/01/2013   Tubular adenoma of colon     Past Surgical History:  Procedure Laterality Date   CARDIOVERSION N/A 03/18/2013   Procedure: CARDIOVERSION;  Surgeon: Candee Furbish, MD;  Location: [facility] ENDOSCOPY;  Service: Cardiovascular;  Laterality: N/A;  pt shocked x4, starting with 120 joules, 150, 200 and 299 joules without successful conversion...Dr. Marlou Porch will have pt follow up with cardiologists...   COLONOSCOPY     POLYPECTOMY     PROSTATE SURGERY  2011     Current Outpatient Medications  Medication Sig Dispense Refill   ACCU-CHEK AVIVA PLUS test strip      acetaminophen (TYLENOL) 325 MG tablet Take 650 mg by mouth every 6 (six) hours as needed for mild pain.     allopurinol (ZYLOPRIM) 300 MG tablet Take 300 mg by mouth daily.      amLODipine (NORVASC) 10 MG tablet TAKE 1 TABLET DAILY 30 tablet 0   atorvastatin (LIPITOR) 40 MG tablet Take 40 mg by mouth daily.      carvedilol (COREG) 12.5 MG tablet TAKE 1 TABLET 2 TIMES DAILY 180 tablet 3   ELIQUIS 5 MG TABS tablet TAKE 1 TABLET(5 MG) BY MOUTH TWICE DAILY 180 tablet 1   flecainide (TAMBOCOR) 50 MG tablet Take 1 tablet (50 mg total) by mouth 2 (two) times daily. 180 tablet 3   glimepiride (AMARYL) 2 MG tablet Take 1 tablet by mouth daily.     hydrochlorothiazide (HYDRODIURIL) 25 MG tablet TAKE 1 TABLET DAILY. 90  tablet 3   KLOR-CON M10 10 MEQ tablet TAKE 1 TABLET DAILY 90 tablet 3   metFORMIN (GLUCOPHAGE) 1000 MG tablet Take 1,000 mg by mouth 2 (two) times daily.     methocarbamol (ROBAXIN) 500 MG tablet Take 500 mg by mouth at bedtime.     ONETOUCH DELICA LANCETS 96Q MISC  tamsulosin (FLOMAX) 0.4 MG CAPS capsule Take 0.4 mg by mouth daily as needed (for urinary frequency).     valsartan (DIOVAN) 80 MG tablet TAKE 1 TABLET BY MOUTH EVERY DAY 90 tablet 0   No current facility-administered medications for this visit.    Allergies:   Enalapril    Social History:  The patient  reports that he has never smoked. He has never used smokeless tobacco. He reports current alcohol use. He reports that he does not use drugs.   Family History:  The patient's family history includes Diabetes in his sister; Heart attack in his brother; Hypertension in an other family member.    ROS:  As per current hx, otherwise all systems are negative    Physical Exam: Blood pressure 120/80, pulse 80, height '5\' 11"'$  (1.803 m), weight 203 lb (92.1 kg), SpO2 95 %.       GEN:  Well nourished, well developed in no acute distress HEENT: Normal NECK: No JVD; No carotid bruits LYMPHATICS: No lymphadenopathy CARDIAC: RRR , no murmurs, rubs, gallops RESPIRATORY:  Clear to auscultation without rales, wheezing or rhonchi  ABDOMEN: Soft, non-tender, non-distended MUSCULOSKELETAL:  No edema; No deformity  SKIN: Warm and dry NEUROLOGIC:  Alert and oriented x 3    EKG: oct. 9, 2023:  NSR at 80.  RAD     Recent Labs: No results found for requested labs within last 365 days.    Lipid Panel    Component Value Date/Time   CHOL 160 11/05/2019 1154   TRIG 61 11/05/2019 1154   HDL 66 11/05/2019 1154   CHOLHDL 2.4 11/05/2019 1154   LDLCALC 82 11/05/2019 1154      Wt Readings from Last 3 Encounters:  11/12/21 203 lb (92.1 kg)  11/06/20 199 lb 6.4 oz (90.4 kg)  10/04/20 199 lb (90.3 kg)      Other studies  Reviewed: Additional studies/ records that were reviewed today include: . Review of the above records demonstrates:    ASSESSMENT AND PLAN:  1.  Paroxysmal atrial fibrillation.   On flecainide Rare HR irregularities  Remains in NSR today    2. Essential hypertension:    BP looks good on recheck      3.  Hyperlipidemia:  Recent labs from his medical doctor  Will have them fax over labs.     We will have him follow-up with me or an APP in 1 year.   Current medicines are reviewed at length with the patient today.  The patient does not have concerns regarding medicines.  Signed, Mertie Moores, MD  11/12/2021 11:54 AM    [facility] [address], [place], [place]  [address] Phone: [phone]; Fax: [phone]

## 2021-11-12 ENCOUNTER — Encounter: Payer: Self-pay | Admitting: Cardiovascular Disease

## 2021-11-12 ENCOUNTER — Ambulatory Visit: Payer: BC Managed Care – PPO | Attending: Cardiovascular Disease | Admitting: Cardiovascular Disease

## 2021-11-12 VITALS — BP 120/80 | HR 80 | Ht 71.0 in | Wt 203.0 lb

## 2021-11-12 DIAGNOSIS — E782 Mixed hyperlipidemia: Secondary | ICD-10-CM | POA: Diagnosis not present

## 2021-11-12 DIAGNOSIS — I480 Paroxysmal atrial fibrillation: Secondary | ICD-10-CM

## 2021-11-12 DIAGNOSIS — I10 Essential (primary) hypertension: Secondary | ICD-10-CM | POA: Diagnosis not present

## 2021-11-12 NOTE — Patient Instructions (Signed)
Medication Instructions:  NO CHANGES *If you need a refill on your cardiac medications before your next appointment, please call your pharmacy*   Lab Work: NONE If you have labs (blood work) drawn today and your tests are completely normal, you will receive your results only by: MyChart Message (if you have MyChart) OR A paper copy in the mail If you have any lab test that is abnormal or we need to change your treatment, we will call you to review the results.   Testing/Procedures: NONE   Follow-Up: At [facility], you and your health needs are our priority.  As part of our continuing mission to provide you with exceptional heart care, we have created designated Provider Care Teams.  These Care Teams include your primary Cardiologist (physician) and Advanced Practice Providers (APPs -  Physician Assistants and Nurse Practitioners) who all work together to provide you with the care you need, when you need it.  We recommend signing up for the patient portal called "MyChart".  Sign up information is provided on this After Visit Summary.  MyChart is used to connect with patients for Virtual Visits (Telemedicine).  Patients are able to view lab/test results, encounter notes, upcoming appointments, etc.  Non-urgent messages can be sent to your provider as well.   To learn more about what you can do with MyChart, go to [email or web address].    Your next appointment:   1 year(s)  The format for your next appointment:   In Person  Provider:   Philip Nahser, MD     Other Instructions NONE  Important Information About Sugar       

## 2021-11-13 ENCOUNTER — Telehealth: Payer: Self-pay | Admitting: Cardiovascular Disease

## 2021-11-13 NOTE — Telephone Encounter (Signed)
Patient called to ask about EKG results due to a My Chart notification. Reviewed with patient. No additional questions or concerns.

## 2021-11-13 NOTE — Telephone Encounter (Signed)
Patient is requesting a call back to review 10/09 EKG.

## 2021-11-26 ENCOUNTER — Other Ambulatory Visit: Payer: Self-pay | Admitting: Cardiovascular Disease

## 2021-12-02 ENCOUNTER — Other Ambulatory Visit: Payer: Self-pay | Admitting: Cardiovascular Disease

## 2021-12-28 ENCOUNTER — Other Ambulatory Visit: Payer: Self-pay | Admitting: Cardiovascular Disease

## 2022-02-11 ENCOUNTER — Other Ambulatory Visit: Payer: Self-pay | Admitting: Cardiovascular Disease

## 2022-02-11 DIAGNOSIS — I4819 Other persistent atrial fibrillation: Secondary | ICD-10-CM

## 2022-02-11 NOTE — Telephone Encounter (Signed)
Eliquis '5mg'$  refill request received. Patient is 72 years old, weight-92.1kg, Crea-1.00 on 06/27/2021 via [facility] from scanned labs from [place], [place], and last seen by Dr. Acie Fredrickson on 11/12/2021. Dose is appropriate based on dosing criteria. Will send in refill to requested pharmacy.

## 2022-08-10 ENCOUNTER — Other Ambulatory Visit: Payer: Self-pay | Admitting: Cardiovascular Disease

## 2022-08-10 DIAGNOSIS — I4819 Other persistent atrial fibrillation: Secondary | ICD-10-CM

## 2022-08-12 NOTE — Telephone Encounter (Signed)
Eliquis 5mg  refill request received. Patient is 72 years old, weight-92.1kg, Crea-1.06 on 07/16/22 via care Everywhere from [place], [place], and last seen by Dr. Elease Hashimoto on 11/12/21. Dose is appropriate based on dosing criteria. Will send in refill to requested pharmacy.

## 2022-11-14 ENCOUNTER — Other Ambulatory Visit: Payer: Self-pay | Admitting: Cardiovascular Disease

## 2022-12-14 ENCOUNTER — Other Ambulatory Visit: Payer: Self-pay | Admitting: Cardiovascular Disease

## 2022-12-15 ENCOUNTER — Other Ambulatory Visit: Payer: Self-pay | Admitting: Cardiovascular Disease

## 2023-01-13 ENCOUNTER — Other Ambulatory Visit: Payer: Self-pay | Admitting: Cardiovascular Disease

## 2023-01-14 ENCOUNTER — Ambulatory Visit: Payer: BC Managed Care – PPO | Admitting: Cardiovascular Disease

## 2023-01-15 ENCOUNTER — Other Ambulatory Visit: Payer: Self-pay | Admitting: Cardiovascular Disease

## 2023-02-05 ENCOUNTER — Other Ambulatory Visit: Payer: Self-pay | Admitting: Cardiovascular Disease

## 2023-02-05 DIAGNOSIS — I4819 Other persistent atrial fibrillation: Secondary | ICD-10-CM

## 2023-02-06 ENCOUNTER — Other Ambulatory Visit: Payer: Self-pay | Admitting: Cardiovascular Disease

## 2023-02-06 NOTE — Telephone Encounter (Signed)
 Prescription refill request for Eliquis  received. Indication: AF Last office visit: 11/12/21  P Nahser MD Scr: 1.0 on 06/27/21  Epic Age: 73 Weight: 92.1kg  Based on above findings Eliquis  5mg  twice daily is the appropriate dose.  Pt is due for labs.  Requested CBC/BMP at upcoming appt on 03/06/22.  Refill approved x 1

## 2023-02-14 ENCOUNTER — Other Ambulatory Visit: Payer: Self-pay | Admitting: Cardiovascular Disease

## 2023-02-28 ENCOUNTER — Encounter: Payer: Self-pay | Admitting: Cardiovascular Disease

## 2023-02-28 ENCOUNTER — Ambulatory Visit: Payer: 59 | Attending: Cardiovascular Disease | Admitting: Cardiovascular Disease

## 2023-02-28 VITALS — BP 124/88 | HR 76 | Ht 71.0 in | Wt 194.2 lb

## 2023-02-28 DIAGNOSIS — E782 Mixed hyperlipidemia: Secondary | ICD-10-CM

## 2023-02-28 DIAGNOSIS — I480 Paroxysmal atrial fibrillation: Secondary | ICD-10-CM | POA: Diagnosis not present

## 2023-02-28 DIAGNOSIS — I10 Essential (primary) hypertension: Secondary | ICD-10-CM

## 2023-02-28 NOTE — Patient Instructions (Signed)
Follow-Up: At [facility], you and your health needs are our priority.  As part of our continuing mission to provide you with exceptional heart care, we have created designated Provider Care Teams.  These Care Teams include your primary Cardiologist (physician) and Advanced Practice Providers (APPs -  Physician Assistants and Nurse Practitioners) who all work together to provide you with the care you need, when you need it.  We recommend signing up for the patient portal called "MyChart".  Sign up information is provided on this After Visit Summary.  MyChart is used to connect with patients for Virtual Visits (Telemedicine).  Patients are able to view lab/test results, encounter notes, upcoming appointments, etc.  Non-urgent messages can be sent to your provider as well.   To learn more about what you can do with MyChart, go to [email or web address].    Your next appointment:   1 year(s)  Provider:   Dr. Servando Salina  Other Instructions   1st Floor: - Lobby - Registration  - Pharmacy  - Lab - Cafe  2nd Floor: - PV Lab - Diagnostic Testing (echo, CT, nuclear med)  3rd Floor: - Vacant  4th Floor: - TCTS (cardiothoracic surgery) - AFib Clinic - Structural Heart Clinic - Vascular Surgery  - Vascular Ultrasound  5th Floor: - HeartCare Cardiology (general and EP) - Clinical Pharmacy for coumadin, hypertension, lipid, weight-loss medications, and med management appointments    Valet parking services will be available as well.

## 2023-02-28 NOTE — Progress Notes (Signed)
.   Cardiology Office Note   Date:  02/28/2023   ID:  Damon Pizza, Damon Krueger, DOB 1950/10/06, MRN [id number]  PCP:  Catha Gosselin, MD  Cardiologist:   Kristeen Miss, MD   No chief complaint on file.  Problem list: 1. Essential hypertension 2. Diabetes mellitus 3. Prostate cancer 4. 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  He was recently admitted 1/7-1/8 with AFib with RVR in the setting of URI (Flu negative).  He was placed on CCB and Eliquis (CHADS2-VASc=2).  CEs were negative.  Echo (02/11/2013):  Mild LVH, EF 50-55%, mild LAE.  Plan was to proceed with DCCV if he did not convert to NSR on his own (? If viral illness resulted in AFib).    Feb. 13, 2015: He underwent cardioversion but unfortunately, he did not convert to sinus rhythm. His Cardizem was increased to 360 mg a day. He feels well. He's completely asymptomatic. He cannot tell that his heart rate is beating irregularly or not.  April 29, 2013:  The patient has failed cardioversion back in February ( as noted above) . We started him on flecainide and he converted to normal sinus rhythm. A stress Myoview study was negative for ischemia. He did not have any evidence of pro-arrhythmia. He's feeling quite well. He's walking about 20 miles a week.  July 14, 2013:  Damon Krueger is doing well.  Still walking 20 miles a week.  He's not had any palpitations. He denies any chest pain or shortness of breath  Sept. 24, 2015:  Damon Krueger is doing . No arrhythmias.  No CP. No dyspnea. Exercising regularly He measures his blood pressure at home on a regular basis. His typical readings were in the 110/73 range    April 08, 2014:   Damon Pizza, Damon Krueger is a 73 y.o. male who presents for follow-up of his atrial fibrillation and hypertension. BP is typically much better at home.  BP is a bit high tonight .   He complains of having "floaters" in his  visual field.    [name]/ 10, 2017"  He developed an allergic reaction to the Enalapril.   It was stopped.  BP runs high on occasion Is back exercising - walks / jogs 25 miles a week .    August 16, 2015:  Doing well .   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  April 05, 2016:  Feeling ok BP has been elevated.  Actually very variable Does not eat salt.  Goes out to eat once a week - chooses a low salt option  Went to the ER 2 weeks ago Amlodipine was increased to 7.5 mg a day   July 09, 2016:  Has been doing well  We added Valsartan  Exercising some   March 18, 2017:  As a as seen today for for follow-up of his paroxysmal atrial fibrillation and hypertension. Notes occasional palpitations - last for several minutes.  No associated dizziness or sweats or  CP or dyspnea  Occurs when he is under stress Exercising regularly .    Sept. 28. 2020  Damon Krueger is seen for follow up of his PAF, HTN We had a virtual visit this past spring . Exercising regulalry  Watches his salt intake   Oct. 4, 2021:  Damon Krueger is seen today for follow up of  his PAF and HTN. No palpitations, no  CP or dyspnea  Exercises regularly  Has had 3rd covid vaccine   Oct. 3, 2022: Damon Krueger is seen today for follow up for his PAF and HTN He is developed some musculoskeletal neck pain.  He is on Robaxin at bedtime for that.  He has a history of paroxysmal atrial fibrillation.  He is on flecainide.  He is maintaining sinus rhythm.  He is also on atorvastatin for his hyperlipidemia. Labs from Dr. Fredirick Maudlin office were reviewed.  His last LDL is 76.  Triglyceride level is 74.  HDL is 61.  Total cholesterol is 152.  Oct. 9, 2023 : Damon Krueger is seen  Having issues with sciatica  Not able to exercise much    I send is seen for follow-up of his paroxysmal atrial fibrillation.  He remains in normal sinus rhythm.  He was in [place] until just last week.   Past Medical  History:  Diagnosis Date   Abnormal urinalysis    a. Tr Hgb by UA 02/2013 - instructed to f/u PCP.   Atrial fibrillation with rapid ventricular response ([facility]) 02/10/2013   Chronic anticoagulation 12/01/2013   Diabetes ([facility]) 02/10/2013   Diabetes mellitus    Diverticulosis    Essential hypertension    Gas 03/09/2014   History of colonic polyps 02/25/2017   HTN (hypertension) 02/10/2013   Hyperlipidemia    Internal hemorrhoid    Loose stools 03/09/2014   PAF (paroxysmal atrial fibrillation) ([facility])    a. Dx 02/2013. Placed on Cardizem, Eliquis (CHA2DS2VASc = 2);  b. 02/2013 Echo: EF 50-55%, mildly dil LA, mild LVH.   Prostate cancer [facility])    Special screening for malignant neoplasms, colon 12/01/2013   Tubular adenoma of colon     Past Surgical History:  Procedure Laterality Date   CARDIOVERSION N/A 03/18/2013   Procedure: CARDIOVERSION;  Surgeon: Donato Schultz, MD;  Location: [facility] ENDOSCOPY;  Service: Cardiovascular;  Laterality: N/A;  pt shocked x4, starting with 120 joules, 150, 200 and 299 joules without successful conversion...Dr. Anne Fu will have pt follow up with cardiologists...   COLONOSCOPY     POLYPECTOMY     PROSTATE SURGERY  2011     Current Outpatient Medications  Medication Sig Dispense Refill   ACCU-CHEK AVIVA PLUS test strip      acetaminophen (TYLENOL) 325 MG tablet Take 650 mg by mouth every 6 (six) hours as needed for mild pain.     allopurinol (ZYLOPRIM) 300 MG tablet Take 300 mg by mouth daily.      amLODipine (NORVASC) 10 MG tablet TAKE 1 TABLET DAILY 90 tablet 0   apixaban (ELIQUIS) 5 MG TABS tablet TAKE 1 TABLET(5 MG) BY MOUTH TWICE DAILY 180 tablet 0   atorvastatin (LIPITOR) 40 MG tablet Take 40 mg by mouth daily.      carvedilol (COREG) 12.5 MG tablet TAKE 1 TABLET 2 TIMES DAILY 60 tablet 0   famotidine (PEPCID) 20 MG tablet 1 tablet at bedtime as needed Orally Once a day for 30 day(s)     flecainide (TAMBOCOR) 50 MG tablet TAKE 1 TABLET TWICE A DAY 180  tablet 0   glimepiride (AMARYL) 4 MG tablet 1 tablet with breakfast or the first main meal of the day Orally Once a day for 90 days     hydrochlorothiazide (HYDRODIURIL) 25 MG tablet TAKE 1 TABLET DAILY. 30 tablet 0   JARDIANCE 25 MG TABS tablet Take 25 mg by mouth daily.  KLOR-CON M10 10 MEQ tablet TAKE 1 TABLET DAILY 30 tablet 0   metFORMIN (GLUCOPHAGE-XR) 500 MG 24 hr tablet 2 tablets with meals orally twice a day for 90 days     methocarbamol (ROBAXIN) 500 MG tablet Take 500 mg by mouth at bedtime.     ONETOUCH DELICA LANCETS 33G MISC      sildenafil (REVATIO) 20 MG tablet TAKE 1 TO 2 TABLETS BY MOUTH AS NEEDED. CAN TAKE UP TO 100 MG AT A TIME Oral for 30     tamsulosin (FLOMAX) 0.4 MG CAPS capsule Take 0.4 mg by mouth daily as needed (for urinary frequency).     valsartan (DIOVAN) 80 MG tablet TAKE 1 TABLET BY MOUTH EVERY DAY 90 tablet 0   valsartan (DIOVAN) 80 MG tablet 1 tablet Orally Once a day     No current facility-administered medications for this visit.    Allergies:   Enalapril    Social History:  The patient  reports that he has never smoked. He has never used smokeless tobacco. He reports current alcohol use. He reports that he does not use drugs.   Family History:  The patient's family history includes Diabetes in his sister; Heart attack in his brother; Hypertension in an other family member.    ROS:  As per current hx, otherwise all systems are negative    Physical Exam: Blood pressure 124/88, pulse 76, height 5\' 11"  (1.803 m), weight 194 lb 3.2 oz (88.1 kg), SpO2 95%.       GEN:  Well nourished, well developed in no acute distress HEENT: Normal NECK: No JVD; No carotid bruits LYMPHATICS: No lymphadenopathy CARDIAC: RRR , no murmurs, rubs, gallops RESPIRATORY:  Clear to auscultation without rales, wheezing or rhonchi  ABDOMEN: Soft, non-tender, non-distended MUSCULOSKELETAL:  No edema; No deformity  SKIN: Warm and dry NEUROLOGIC:  Alert and oriented x  3    EKG:    EKG Interpretation Date/Time:  Friday February 28 2023 15:41:04 EST Ventricular Rate:  77 PR Interval:  180 QRS Duration:  114 QT Interval:  422 QTC Calculation: 477 R Axis:   245  Text Interpretation: Normal sinus rhythm Right superior axis deviation When compared with ECG of 24-Mar-2016 15:53, QRS axis Shifted left Confirmed by Kristeen Miss ([id number]) on 02/28/2023 4:00:47 PM       Recent Labs: No results found for requested labs within last 365 days.    Lipid Panel    Component Value Date/Time   CHOL 160 11/05/2019 1154   TRIG 61 11/05/2019 1154   HDL 66 11/05/2019 1154   CHOLHDL 2.4 11/05/2019 1154   LDLCALC 82 11/05/2019 1154      Wt Readings from Last 3 Encounters:  02/28/23 194 lb 3.2 oz (88.1 kg)  11/12/21 203 lb (92.1 kg)  11/06/20 199 lb 6.4 oz (90.4 kg)      Other studies Reviewed: Additional studies/ records that were reviewed today include: . Review of the above records demonstrates:    ASSESSMENT AND PLAN:  1.  Paroxysmal atrial fibrillation.  He is maintaining sinus rhythm.  Continue flecainide, coreg .  Continue Eliquis.    2. Essential hypertension:    BP looks good on recheck       3.  Hyperlipidemia:  lipids are stabl e    We will have him follow-up with me or an APP in 1 year.   Current medicines are reviewed at length with the patient today.  The patient does not have concerns regarding  medicines.  Signed, Kristeen Miss, MD  02/28/2023 4:00 PM    [facility] [address] [place], [place], [place]  [address] Phone: [phone]; Fax: [phone]

## 2023-03-03 ENCOUNTER — Telehealth: Payer: Self-pay | Admitting: Cardiovascular Disease

## 2023-03-03 NOTE — Telephone Encounter (Signed)
Returned call to patient and reviewed the below information from last OV's EKG. He states he couldn't access this via MyChart and wanted make sure it was good. No further questions.   EKG:    EKG Interpretation Date/Time:                  Friday February 28 2023 15:41:04 EST Ventricular Rate:         77 PR Interval:                 180 QRS Duration:             114 QT Interval:                 422 QTC Calculation:477 R Axis:                         245 Text Interpretation:Normal sinus rhythm Right superior axis deviation When compared with ECG of 24-Mar-2016 15:53, QRS axis Shifted left Confirmed by Kristeen Miss ([id number]) on 02/28/2023 4:00:47 PM  1.  Paroxysmal atrial fibrillation.  He is maintaining sinus rhythm.  Continue flecainide, coreg .  Continue Eliquis.

## 2023-03-03 NOTE — Telephone Encounter (Signed)
  Patient wanted to discuss the results of his EKG that he had a last Friday's appointment.

## 2023-03-03 NOTE — Telephone Encounter (Signed)
Patient is following up, hopefull for a call back this afternoon if possible.

## 2023-03-07 ENCOUNTER — Ambulatory Visit: Payer: Self-pay | Admitting: Cardiovascular Disease

## 2023-03-07 ENCOUNTER — Ambulatory Visit: Payer: BC Managed Care – PPO | Admitting: Cardiovascular Disease

## 2023-03-16 ENCOUNTER — Other Ambulatory Visit: Payer: Self-pay | Admitting: Cardiovascular Disease

## 2023-04-12 ENCOUNTER — Other Ambulatory Visit: Payer: Self-pay | Admitting: Cardiovascular Disease

## 2023-04-30 ENCOUNTER — Other Ambulatory Visit: Payer: Self-pay | Admitting: Cardiovascular Disease

## 2023-05-25 ENCOUNTER — Other Ambulatory Visit: Payer: Self-pay | Admitting: Cardiovascular Disease

## 2023-05-25 DIAGNOSIS — I4819 Other persistent atrial fibrillation: Secondary | ICD-10-CM

## 2023-05-26 NOTE — Telephone Encounter (Signed)
 Prescription refill request for Eliquis  received. Indication:afib Last office visit:1/25 ZOX:WRUEA labs Age: 73 Weight:88.1  kg  Prescription refilled

## 2023-06-09 ENCOUNTER — Telehealth: Payer: Self-pay | Admitting: Cardiovascular Disease

## 2023-06-09 ENCOUNTER — Other Ambulatory Visit: Payer: Self-pay

## 2023-06-09 NOTE — Telephone Encounter (Signed)
*  STAT* If patient is at the pharmacy, call can be transferred to refill team.   1. Which medications need to be refilled? (please list name of each medication and dose if known)   apixaban  (ELIQUIS ) 5 MG TABS tablet    2. Which pharmacy/location (including street and city if local pharmacy) is medication to be sent to? [id number] - [place], [place] - [address]   3. Do they need a 30 day or 90 day supply?   90 day supply + refills   Please remove note that says an appointment is needed. Pharmacy will not distribute full supply.

## 2023-06-10 ENCOUNTER — Other Ambulatory Visit: Payer: Self-pay

## 2023-06-10 DIAGNOSIS — I4819 Other persistent atrial fibrillation: Secondary | ICD-10-CM

## 2023-06-10 MED ORDER — APIXABAN 5 MG PO TABS: 5.0000 mg | ORAL_TABLET | Freq: Two times a day (BID) | ORAL | 1 refills | Status: DC

## 2023-07-16 ENCOUNTER — Other Ambulatory Visit: Payer: Self-pay | Admitting: Cardiovascular Disease

## 2023-07-16 DIAGNOSIS — I4819 Other persistent atrial fibrillation: Secondary | ICD-10-CM

## 2023-07-16 NOTE — Telephone Encounter (Signed)
 Prescription refill request for Eliquis  received. Indication:afib Last office visit:1/25 Scr:1.06  6/24 Age: 73 Weight:88.1  kg  Prescription refilled

## 2023-12-29 ENCOUNTER — Other Ambulatory Visit: Payer: Self-pay

## 2023-12-29 DIAGNOSIS — I4819 Other persistent atrial fibrillation: Secondary | ICD-10-CM

## 2023-12-29 MED ORDER — APIXABAN 5 MG PO TABS: 5.0000 mg | ORAL_TABLET | Freq: Two times a day (BID) | ORAL | 5 refills | Status: AC

## 2023-12-29 NOTE — Telephone Encounter (Signed)
 Eliquis  5mg  refill request received. Patient is 73 years old, weight-88.1kg, Crea-1.32 on 08/27/23 via [facility] from [place], [place], and last seen by Dr. Alveta on 02/28/23 & recall for Dr Sheena pending 02/2024. Dose is appropriate based on dosing criteria. Will send in refill to requested pharmacy.

## 2024-02-02 ENCOUNTER — Other Ambulatory Visit: Payer: Self-pay

## 2024-02-04 MED ORDER — AMLODIPINE BESYLATE 10 MG PO TABS: 10.0000 mg | ORAL_TABLET | Freq: Every day | ORAL | 0 refills | Status: AC

## 2024-02-04 MED ORDER — FLECAINIDE ACETATE 50 MG PO TABS: 50.0000 mg | ORAL_TABLET | Freq: Two times a day (BID) | ORAL | 2 refills | Status: AC

## 2024-02-12 ENCOUNTER — Encounter: Payer: Self-pay | Admitting: Cardiology

## 2024-03-03 ENCOUNTER — Other Ambulatory Visit: Payer: Self-pay

## 2024-06-04 ENCOUNTER — Ambulatory Visit: Admitting: Cardiology
